# Patient Record
Sex: Male | Born: 1955 | Race: Black or African American | Hispanic: No | Marital: Single | Smoking: Current every day smoker
Health system: Southern US, Community
[De-identification: ages and names within clinical notes are randomized; demographics above are authoritative.]

## PROBLEM LIST (undated history)

## (undated) DIAGNOSIS — J45909 Unspecified asthma, uncomplicated: Secondary | ICD-10-CM

## (undated) DIAGNOSIS — K7460 Unspecified cirrhosis of liver: Secondary | ICD-10-CM

## (undated) DIAGNOSIS — F32A Depression, unspecified: Secondary | ICD-10-CM

## (undated) DIAGNOSIS — R7303 Prediabetes: Secondary | ICD-10-CM

## (undated) DIAGNOSIS — F419 Anxiety disorder, unspecified: Secondary | ICD-10-CM

## (undated) DIAGNOSIS — A159 Respiratory tuberculosis unspecified: Secondary | ICD-10-CM

## (undated) DIAGNOSIS — I10 Essential (primary) hypertension: Secondary | ICD-10-CM

## (undated) DIAGNOSIS — R0600 Dyspnea, unspecified: Secondary | ICD-10-CM

## (undated) DIAGNOSIS — Z87442 Personal history of urinary calculi: Secondary | ICD-10-CM

## (undated) DIAGNOSIS — C801 Malignant (primary) neoplasm, unspecified: Secondary | ICD-10-CM

## (undated) HISTORY — PX: CYSTOSCOPY: SUR368

## (undated) HISTORY — PX: OTHER SURGICAL HISTORY: SHX169

## (undated) HISTORY — PX: EYE SURGERY: SHX253

---

## 2001-12-19 ENCOUNTER — Emergency Department (HOSPITAL_COMMUNITY): Admission: EM | Admit: 2001-12-19 | Discharge: 2001-12-19 | Payer: Self-pay | Admitting: Emergency Medicine

## 2002-02-14 ENCOUNTER — Emergency Department (HOSPITAL_COMMUNITY): Admission: EM | Admit: 2002-02-14 | Discharge: 2002-02-14 | Payer: Self-pay | Admitting: Emergency Medicine

## 2002-02-14 ENCOUNTER — Encounter: Payer: Self-pay | Admitting: Emergency Medicine

## 2002-09-11 ENCOUNTER — Encounter: Payer: Self-pay | Admitting: Emergency Medicine

## 2002-09-11 ENCOUNTER — Emergency Department (HOSPITAL_COMMUNITY): Admission: EM | Admit: 2002-09-11 | Discharge: 2002-09-11 | Payer: Self-pay | Admitting: Emergency Medicine

## 2003-09-15 ENCOUNTER — Emergency Department (HOSPITAL_COMMUNITY): Admission: EM | Admit: 2003-09-15 | Discharge: 2003-09-15 | Payer: Self-pay | Admitting: Emergency Medicine

## 2005-07-19 ENCOUNTER — Emergency Department (HOSPITAL_COMMUNITY): Admission: EM | Admit: 2005-07-19 | Discharge: 2005-07-19 | Payer: Self-pay | Admitting: Family Medicine

## 2005-10-18 ENCOUNTER — Emergency Department (HOSPITAL_COMMUNITY): Admission: EM | Admit: 2005-10-18 | Discharge: 2005-10-18 | Payer: Self-pay | Admitting: Emergency Medicine

## 2006-02-18 ENCOUNTER — Ambulatory Visit (HOSPITAL_COMMUNITY): Admission: RE | Admit: 2006-02-18 | Discharge: 2006-02-18 | Payer: Self-pay | Admitting: Surgery

## 2006-02-20 ENCOUNTER — Emergency Department (HOSPITAL_COMMUNITY): Admission: EM | Admit: 2006-02-20 | Discharge: 2006-02-21 | Payer: Self-pay | Admitting: Emergency Medicine

## 2006-02-24 ENCOUNTER — Emergency Department (HOSPITAL_COMMUNITY): Admission: EM | Admit: 2006-02-24 | Discharge: 2006-02-25 | Payer: Self-pay | Admitting: Emergency Medicine

## 2015-03-31 ENCOUNTER — Emergency Department (HOSPITAL_COMMUNITY)
Admission: EM | Admit: 2015-03-31 | Discharge: 2015-03-31 | Disposition: A | Discharge status: Home / Self Care | Payer: Self-pay | Source: Home / Self Care | Attending: Family Medicine | Admitting: Family Medicine

## 2015-03-31 ENCOUNTER — Encounter (HOSPITAL_COMMUNITY): Payer: Self-pay | Admitting: Emergency Medicine

## 2015-03-31 HISTORY — DX: Unspecified asthma, uncomplicated: J45.909

## 2015-03-31 HISTORY — DX: Essential (primary) hypertension: I10

## 2015-03-31 NOTE — ED Notes (Signed)
Patient not in treatment room, no explanation to staff-

## 2015-03-31 NOTE — ED Notes (Signed)
Hypertension for 2 days per patient, has checked blood pressure at home and now feeling bad in general.  Off blood pressure medicine for 2 weeks, ran out

## 2016-07-15 ENCOUNTER — Emergency Department (HOSPITAL_BASED_OUTPATIENT_CLINIC_OR_DEPARTMENT_OTHER)
Admission: EM | Admit: 2016-07-15 | Discharge: 2016-07-15 | Disposition: A | Discharge status: Home / Self Care | Payer: No Typology Code available for payment source | Attending: Emergency Medicine | Admitting: Emergency Medicine

## 2016-07-15 ENCOUNTER — Emergency Department (HOSPITAL_BASED_OUTPATIENT_CLINIC_OR_DEPARTMENT_OTHER): Payer: No Typology Code available for payment source

## 2016-07-15 ENCOUNTER — Encounter (HOSPITAL_BASED_OUTPATIENT_CLINIC_OR_DEPARTMENT_OTHER): Payer: Self-pay | Admitting: *Deleted

## 2016-07-15 DIAGNOSIS — Y9389 Activity, other specified: Secondary | ICD-10-CM | POA: Insufficient documentation

## 2016-07-15 DIAGNOSIS — S199XXA Unspecified injury of neck, initial encounter: Secondary | ICD-10-CM | POA: Diagnosis present

## 2016-07-15 DIAGNOSIS — Y999 Unspecified external cause status: Secondary | ICD-10-CM | POA: Diagnosis not present

## 2016-07-15 DIAGNOSIS — I10 Essential (primary) hypertension: Secondary | ICD-10-CM | POA: Insufficient documentation

## 2016-07-15 DIAGNOSIS — S161XXA Strain of muscle, fascia and tendon at neck level, initial encounter: Secondary | ICD-10-CM | POA: Diagnosis not present

## 2016-07-15 DIAGNOSIS — Y92410 Unspecified street and highway as the place of occurrence of the external cause: Secondary | ICD-10-CM | POA: Diagnosis not present

## 2016-07-15 DIAGNOSIS — J45909 Unspecified asthma, uncomplicated: Secondary | ICD-10-CM | POA: Insufficient documentation

## 2016-07-15 DIAGNOSIS — F17200 Nicotine dependence, unspecified, uncomplicated: Secondary | ICD-10-CM | POA: Diagnosis not present

## 2016-07-15 DIAGNOSIS — S40022A Contusion of left upper arm, initial encounter: Secondary | ICD-10-CM | POA: Diagnosis not present

## 2016-07-15 MED ORDER — IBUPROFEN 800 MG PO TABS
800.0000 mg | ORAL_TABLET | Freq: Once | ORAL | Status: AC
  Administered 2016-07-15: 800 mg via ORAL
  Filled 2016-07-15: qty 1

## 2016-07-15 MED ORDER — METHOCARBAMOL 500 MG PO TABS: 500.0000 mg | ORAL_TABLET | Freq: Two times a day (BID) | ORAL | 0 refills | Status: DC

## 2016-07-15 MED ORDER — METHOCARBAMOL 500 MG PO TABS
750.0000 mg | ORAL_TABLET | Freq: Once | ORAL | Status: AC
  Administered 2016-07-15: 750 mg via ORAL
  Filled 2016-07-15: qty 2

## 2016-07-15 MED ORDER — NAPROXEN 500 MG PO TABS: 500.0000 mg | ORAL_TABLET | Freq: Two times a day (BID) | ORAL | 0 refills | Status: DC
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
## 2016-07-15 NOTE — Discharge Instructions (Signed)
Take naproxen for pain. Robaxin for muscle spasms. Try heating pads, stretches. Follow up with family doctor if not improving. Return if worsening symptoms.
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
## 2019-01-16 ENCOUNTER — Other Ambulatory Visit: Payer: Self-pay

## 2019-01-16 ENCOUNTER — Encounter (HOSPITAL_COMMUNITY): Payer: Self-pay

## 2019-01-16 ENCOUNTER — Emergency Department (HOSPITAL_COMMUNITY)
Admission: EM | Admit: 2019-01-16 | Discharge: 2019-01-16 | Disposition: A | Discharge status: Home / Self Care | Payer: No Typology Code available for payment source | Attending: Emergency Medicine | Admitting: Emergency Medicine

## 2019-01-16 DIAGNOSIS — Y92410 Unspecified street and highway as the place of occurrence of the external cause: Secondary | ICD-10-CM | POA: Insufficient documentation

## 2019-01-16 DIAGNOSIS — Y93I9 Activity, other involving external motion: Secondary | ICD-10-CM | POA: Insufficient documentation

## 2019-01-16 DIAGNOSIS — M7918 Myalgia, other site: Secondary | ICD-10-CM

## 2019-01-16 DIAGNOSIS — Y998 Other external cause status: Secondary | ICD-10-CM | POA: Insufficient documentation

## 2019-01-16 DIAGNOSIS — F17210 Nicotine dependence, cigarettes, uncomplicated: Secondary | ICD-10-CM | POA: Insufficient documentation

## 2019-01-16 DIAGNOSIS — S199XXA Unspecified injury of neck, initial encounter: Secondary | ICD-10-CM | POA: Diagnosis not present

## 2019-01-16 DIAGNOSIS — I10 Essential (primary) hypertension: Secondary | ICD-10-CM | POA: Insufficient documentation

## 2019-01-16 MED ORDER — CYCLOBENZAPRINE HCL 10 MG PO TABS: 10.0000 mg | ORAL_TABLET | Freq: Two times a day (BID) | ORAL | 0 refills | Status: DC | PRN
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
## 2019-01-16 NOTE — Discharge Instructions (Signed)
Please read attached information. If you experience any new or worsening signs or symptoms please return to the emergency room for evaluation. Please follow-up with your primary care provider or specialist as discussed. Please use medication prescribed only as directed and discontinue taking if you have any concerning signs or symptoms.   °

## 2019-01-16 NOTE — ED Notes (Signed)
Patient verbalizes understanding of discharge instructions . Opportunity for questions and answers were provided . Armband removed by staff ,Pt discharged from ED. W/C  offered at D/C  and Declined W/C at D/C and was escorted to lobby by RN.  

## 2020-07-30 ENCOUNTER — Other Ambulatory Visit: Payer: Self-pay | Admitting: Neurosurgery

## 2020-07-30 ENCOUNTER — Telehealth: Payer: Self-pay

## 2020-07-30 DIAGNOSIS — M5136 Other intervertebral disc degeneration, lumbar region: Secondary | ICD-10-CM

## 2020-07-31 NOTE — Telephone Encounter (Signed)
Phone call to patient to verify medication list and allergies for myelogram procedure. All medications pt is currently taking is safe to continue to take for myelogram procedure. Pt verbalized understanding. 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 Pt verbalized understanding.

## 2020-08-07 ENCOUNTER — Other Ambulatory Visit: Payer: Self-pay

## 2020-08-07 ENCOUNTER — Ambulatory Visit
Admission: RE | Admit: 2020-08-07 | Discharge: 2020-08-07 | Disposition: A | Discharge status: Home / Self Care | Payer: Non-veteran care | Source: Ambulatory Visit | Attending: Neurosurgery | Admitting: Neurosurgery

## 2020-08-07 DIAGNOSIS — M5136 Other intervertebral disc degeneration, lumbar region: Secondary | ICD-10-CM

## 2020-08-07 MED ORDER — DIAZEPAM 5 MG PO TABS
5.0000 mg | ORAL_TABLET | Freq: Once | ORAL | Status: AC
  Administered 2020-08-07: 5 mg via ORAL

## 2020-08-07 NOTE — Discharge Instructions (Signed)

## 2020-08-07 NOTE — Progress Notes (Signed)
Pt arrived for Myelogram procedure. After pt signed consent, vitals obtained, 5 mg PO Valium given pt decided he did not want this procedure today. Pt reported it was too close to christmas and would like to reschedule. Cathy, scheduler, notified of this and will contact patient.

## 2020-08-19 ENCOUNTER — Inpatient Hospital Stay
Admission: RE | Admit: 2020-08-19 | Discharge: 2020-08-19 | Disposition: A | Discharge status: Home / Self Care | Payer: Non-veteran care | Source: Ambulatory Visit | Attending: Neurosurgery | Admitting: Neurosurgery

## 2020-08-19 ENCOUNTER — Other Ambulatory Visit: Payer: Non-veteran care

## 2020-08-19 NOTE — Discharge Instructions (Signed)

## 2020-09-24 ENCOUNTER — Ambulatory Visit
Admission: RE | Admit: 2020-09-24 | Discharge: 2020-09-24 | Disposition: A | Discharge status: Home / Self Care | Payer: Non-veteran care | Source: Ambulatory Visit | Attending: Neurosurgery | Admitting: Neurosurgery

## 2020-09-24 ENCOUNTER — Other Ambulatory Visit: Payer: Self-pay

## 2020-09-24 ENCOUNTER — Other Ambulatory Visit: Payer: Self-pay | Admitting: Family Medicine

## 2020-09-24 MED ORDER — DIAZEPAM 5 MG PO TABS
5.0000 mg | ORAL_TABLET | Freq: Once | ORAL | Status: AC
  Administered 2020-09-24: 5 mg via ORAL

## 2020-09-24 MED ORDER — IOPAMIDOL (ISOVUE-M 200) INJECTION 41%
18.0000 mL | Freq: Once | INTRAMUSCULAR | Status: AC
  Administered 2020-09-24: 18 mL via INTRATHECAL

## 2020-09-24 NOTE — Discharge Instructions (Signed)

## 2021-06-01 ENCOUNTER — Encounter (HOSPITAL_COMMUNITY): Payer: Self-pay | Admitting: Student

## 2021-06-01 ENCOUNTER — Emergency Department (HOSPITAL_COMMUNITY)
Admission: EM | Admit: 2021-06-01 | Discharge: 2021-06-02 | Discharge status: Against Medical Advice | Payer: No Typology Code available for payment source | Attending: Student | Admitting: Student

## 2021-06-01 ENCOUNTER — Other Ambulatory Visit: Payer: Self-pay

## 2021-06-01 DIAGNOSIS — M7989 Other specified soft tissue disorders: Secondary | ICD-10-CM | POA: Insufficient documentation

## 2021-06-01 DIAGNOSIS — Z5321 Procedure and treatment not carried out due to patient leaving prior to being seen by health care provider: Secondary | ICD-10-CM | POA: Diagnosis not present

## 2021-06-01 MED ORDER — DIPHENHYDRAMINE HCL 25 MG PO CAPS
25.0000 mg | ORAL_CAPSULE | Freq: Once | ORAL | Status: AC
  Administered 2021-06-01: 25 mg via ORAL
  Filled 2021-06-01: qty 1

## 2021-06-01 NOTE — ED Provider Notes (Signed)
Emergency Medicine Provider Triage Evaluation Note  VASHON RIORDAN , a 65 y.o. male  was evaluated in triage.  Pt complains of right sided facial pain/swelling which is improving now. Patient states that earlier this evening he was eating chocolate with almonds/toffee and while he was chewing he felt a cramping pain to his right jaw and looked and it appeared the right side of his face was swelling. Overall this is improving, no current pain, state swelling is much better. No alleviating/aggravating factors.   Review of Systems  Positive: Jaw pain/swelling Negative: N/V, dyspnea, dysphagia, rash, fever  Physical Exam  BP (!) 164/106 (BP Location: Right Arm)   Pulse 71   Temp 98.5 F (36.9 C) (Oral)   Resp 20   SpO2 97%  Gen:   Awake, no distress   Resp:  Normal effort  MSK:   Moves extremities without difficulty  Other:  No angioedema present. Posterior oropharynx is symmetric appearing without edema. Patient tolerating own secretions without difficulty. No trismus. No drooling. No hot potato voice. No swelling beneath the tongue, submandibular compartment is soft. No palpable dental abscess noted.    Medical Decision Making  Medically screening exam initiated at 10:11 PM.  Appropriate orders placed.  ABDULAI BLAYLOCK was informed that the remainder of the evaluation will be completed by another provider, this initial triage assessment does not replace that evaluation, and the importance of remaining in the ED until their evaluation is complete.  Jaw pain/swelling.  No significant appreciable swelling on exam. No findings of angioedema. Not consistent w/ anaphylaxis. Will trial benadryl for symptomatic management.    Leafy Kindle 06/01/21 2220    Teressa Lower, MD 06/01/21 2354

## 2021-06-01 NOTE — ED Triage Notes (Signed)
Pt reports he was eating chocolate and nuts (common for him) says his right side of his face started to swell.  The swelling has since started to go down, no trouble swallowing, can speak in complete sentences.

## 2021-06-02 NOTE — ED Notes (Signed)
Pt called multiple times no answer 

## 2021-06-02 NOTE — ED Notes (Signed)
Called to take back to room.  No response.

## 2021-07-26 ENCOUNTER — Emergency Department (HOSPITAL_COMMUNITY): Payer: No Typology Code available for payment source

## 2021-07-26 ENCOUNTER — Other Ambulatory Visit: Payer: Self-pay

## 2021-07-26 ENCOUNTER — Emergency Department (HOSPITAL_COMMUNITY)
Admission: EM | Admit: 2021-07-26 | Discharge: 2021-07-26 | Disposition: A | Discharge status: Home / Self Care | Payer: No Typology Code available for payment source | Attending: Emergency Medicine | Admitting: Emergency Medicine

## 2021-07-26 ENCOUNTER — Encounter (HOSPITAL_COMMUNITY): Payer: Self-pay | Admitting: Emergency Medicine

## 2021-07-26 DIAGNOSIS — Z79899 Other long term (current) drug therapy: Secondary | ICD-10-CM | POA: Diagnosis not present

## 2021-07-26 DIAGNOSIS — I10 Essential (primary) hypertension: Secondary | ICD-10-CM | POA: Insufficient documentation

## 2021-07-26 DIAGNOSIS — R3121 Asymptomatic microscopic hematuria: Secondary | ICD-10-CM | POA: Diagnosis not present

## 2021-07-26 DIAGNOSIS — J45909 Unspecified asthma, uncomplicated: Secondary | ICD-10-CM | POA: Insufficient documentation

## 2021-07-26 DIAGNOSIS — R112 Nausea with vomiting, unspecified: Secondary | ICD-10-CM | POA: Diagnosis present

## 2021-07-26 DIAGNOSIS — F17210 Nicotine dependence, cigarettes, uncomplicated: Secondary | ICD-10-CM | POA: Diagnosis not present

## 2021-07-26 DIAGNOSIS — Z20822 Contact with and (suspected) exposure to covid-19: Secondary | ICD-10-CM | POA: Insufficient documentation

## 2021-07-26 DIAGNOSIS — R197 Diarrhea, unspecified: Secondary | ICD-10-CM | POA: Diagnosis not present

## 2021-07-26 LAB — CBC WITH DIFFERENTIAL/PLATELET
Abs Immature Granulocytes: 0.04 10*3/uL (ref 0.00–0.07)
Basophils Absolute: 0.1 10*3/uL (ref 0.0–0.1)
Basophils Relative: 1 %
Eosinophils Absolute: 0.2 10*3/uL (ref 0.0–0.5)
Eosinophils Relative: 2 %
HCT: 47.7 % (ref 39.0–52.0)
Hemoglobin: 15.8 g/dL (ref 13.0–17.0)
Immature Granulocytes: 0 %
Lymphocytes Relative: 45 %
Lymphs Abs: 5.2 10*3/uL — ABNORMAL HIGH (ref 0.7–4.0)
MCH: 30 pg (ref 26.0–34.0)
MCHC: 33.1 g/dL (ref 30.0–36.0)
MCV: 90.7 fL (ref 80.0–100.0)
Monocytes Absolute: 0.7 10*3/uL (ref 0.1–1.0)
Monocytes Relative: 7 %
Neutro Abs: 5.1 10*3/uL (ref 1.7–7.7)
Neutrophils Relative %: 45 %
Platelets: 239 10*3/uL (ref 150–400)
RBC: 5.26 MIL/uL (ref 4.22–5.81)
RDW: 14.1 % (ref 11.5–15.5)
WBC: 11.4 10*3/uL — ABNORMAL HIGH (ref 4.0–10.5)
nRBC: 0 % (ref 0.0–0.2)

## 2021-07-26 LAB — COMPREHENSIVE METABOLIC PANEL
ALT: 22 U/L (ref 0–44)
AST: 35 U/L (ref 15–41)
Albumin: 4.6 g/dL (ref 3.5–5.0)
Alkaline Phosphatase: 90 U/L (ref 38–126)
Anion gap: 12 (ref 5–15)
BUN: 14 mg/dL (ref 8–23)
CO2: 26 mmol/L (ref 22–32)
Calcium: 9.9 mg/dL (ref 8.9–10.3)
Chloride: 98 mmol/L (ref 98–111)
Creatinine, Ser: 1.05 mg/dL (ref 0.61–1.24)
GFR, Estimated: >60 mL/min (ref >60)
Glucose, Bld: 101 mg/dL — ABNORMAL HIGH (ref 70–99)
Potassium: 3.2 mmol/L — ABNORMAL LOW (ref 3.5–5.1)
Sodium: 136 mmol/L (ref 135–145)
Total Bilirubin: 0.4 mg/dL (ref 0.3–1.2)
Total Protein: 8.1 g/dL (ref 6.5–8.1)

## 2021-07-26 LAB — URINALYSIS, ROUTINE W REFLEX MICROSCOPIC
Bilirubin Urine: NEGATIVE
Glucose, UA: NEGATIVE mg/dL
Ketones, ur: 5 mg/dL — AB
Leukocytes,Ua: NEGATIVE
Nitrite: NEGATIVE
Protein, ur: 30 mg/dL — AB
RBC / HPF: >50 RBC/hpf — ABNORMAL HIGH (ref 0–5)
Specific Gravity, Urine: 1.018 (ref 1.005–1.030)
pH: 8 (ref 5.0–8.0)

## 2021-07-26 LAB — RESP PANEL BY RT-PCR (FLU A&B, COVID) ARPGX2
Influenza A by PCR: NEGATIVE
Influenza B by PCR: NEGATIVE
SARS Coronavirus 2 by RT PCR: NEGATIVE

## 2021-07-26 LAB — ETHANOL: Alcohol, Ethyl (B): <10 mg/dL (ref <10)

## 2021-07-26 LAB — TROPONIN I (HIGH SENSITIVITY): Troponin I (High Sensitivity): 5 ng/L (ref <18)

## 2021-07-26 LAB — LIPASE, BLOOD: Lipase: 130 U/L — ABNORMAL HIGH (ref 11–51)

## 2021-07-26 MED ORDER — ONDANSETRON HCL 4 MG/2ML IJ SOLN
4.0000 mg | Freq: Once | INTRAMUSCULAR | Status: AC
  Administered 2021-07-26: 4 mg via INTRAVENOUS
  Filled 2021-07-26: qty 2

## 2021-07-26 MED ORDER — ONDANSETRON 4 MG PO TBDP
4.0000 mg | ORAL_TABLET | Freq: Once | ORAL | Status: DC
  Filled 2021-07-26: qty 1

## 2021-07-26 MED ORDER — LABETALOL HCL 5 MG/ML IV SOLN
5.0000 mg | Freq: Once | INTRAVENOUS | Status: AC
  Administered 2021-07-26: 5 mg via INTRAVENOUS
  Filled 2021-07-26: qty 4

## 2021-07-26 MED ORDER — SODIUM CHLORIDE 0.9 % IV BOLUS
1000.0000 mL | Freq: Once | INTRAVENOUS | Status: AC
  Administered 2021-07-26: 1000 mL via INTRAVENOUS

## 2021-07-26 MED ORDER — METOCLOPRAMIDE HCL 10 MG PO TABS
10.0000 mg | ORAL_TABLET | Freq: Once | ORAL | Status: AC
  Administered 2021-07-26: 10 mg via ORAL
  Filled 2021-07-26: qty 1

## 2021-07-26 MED ORDER — AMLODIPINE BESYLATE 5 MG PO TABS
5.0000 mg | ORAL_TABLET | Freq: Once | ORAL | Status: AC
  Administered 2021-07-26: 5 mg via ORAL
  Filled 2021-07-26: qty 1

## 2021-07-26 MED ORDER — KETOROLAC TROMETHAMINE 15 MG/ML IJ SOLN
15.0000 mg | Freq: Once | INTRAMUSCULAR | Status: AC
  Administered 2021-07-26: 15 mg via INTRAVENOUS
  Filled 2021-07-26: qty 1

## 2021-07-26 MED ORDER — ONDANSETRON HCL 4 MG PO TABS: 4.0000 mg | ORAL_TABLET | Freq: Four times a day (QID) | ORAL | 0 refills | Status: AC

## 2021-07-26 NOTE — Discharge Instructions (Addendum)
Your lab work looks okay today.  As we discussed it is important that you limit your alcohol intake to avoid pancreatitis.  Be sure to stay hydrated as much as you can.  I have sent medication for nausea to your pharmacy for you to use as needed.  As discussed, you also have blood in your urine.  Be sure to stay as hydrated as you can and follow-up with your primary care provider about this.

## 2021-07-26 NOTE — ED Provider Notes (Signed)
Emergency Medicine Provider Triage Evaluation Note  Brian Lane , a 65 y.o. male  was evaluated in triage.  Presents to the emergency department with complaints of lead poisoning after taking unknown pills and then snorting an unknown substance.  Patient requesting to have his stomach pumped.  Patient with diarrhea in the waiting room bathroom and vomited on the floor in triage.  Denies associated symptoms.  Review of Systems  Positive: Vomiting diarrhea Negative: Abdominal pain, altered mental status  Physical Exam  BP (!) 173/102 (BP Location: Right Arm)   Pulse 76   Temp 98.7 F (37.1 C) (Oral)   Resp (!) 22   SpO2 92%  Gen:   Awake, no distress   Resp:  Normal effort  MSK:   Moves extremities without difficulty  Other:  Actively vomiting  Medical Decision Making  Medically screening exam initiated at 2:16 AM.  Appropriate orders placed.  Brian Lane was informed that the remainder of the evaluation will be completed by another provider, this initial triage assessment does not replace that evaluation, and the importance of remaining in the ED until their evaluation is complete.  Vomiting and diarrhea.  Unknown drug ingestion.   Jahmier Willadsen, Gwenlyn Perking 07/26/21 8250    Fatima Blank, MD 07/26/21 [phone]

## 2021-07-26 NOTE — ED Provider Notes (Addendum)
[facility] EMERGENCY DEPARTMENT Provider Note   CSN: [id number] Arrival date & time: 07/26/21  0125     History Chief Complaint  Patient presents with   Emesis   Diarrhea    Brian Lane is a 65 y.o. male with a past medical history of asthma and hypertension presenting today with complaint of nausea, vomiting, diarrhea and chills.  Patient reports that this all began early this morning.  It is started when he became nauseous, heard beeping that was not there and "became very cold."  No known sick contacts.  Complaining of chest pain.  No abdominal pain but overall myalgias.  Also stated that he took some pills last night that somebody gave him.  They reported that it was heroin however he feels as though there was something else, "mixed in with the machine."  He states that he occasionally uses crack cocaine and heroin.  What he took today did not appear to be heroin because the pill was "harder than usual."     Past Medical History:  Diagnosis Date   Asthma    Hypertension     There are no problems to display for this patient.   No past surgical history on file.     No family history on file.  Social History   Tobacco Use   Smoking status: Every Day    Types: Cigarettes   Smokeless tobacco: Never  Substance Use Topics   Alcohol use: Yes    Comment: occ   Drug use: Never    Home Medications Prior to Admission medications   Medication Sig Start Date End Date Taking? Authorizing Provider  amLODipine (NORVASC) 5 MG tablet Take 5 mg by mouth daily.    [provider]  cyclobenzaprine (FLEXERIL) 10 MG tablet Take 1 tablet (10 mg total) by mouth 2 (two) times daily as needed for muscle spasms. Patient not taking: Reported on 07/31/2020 01/16/19   Hedges, Dellis Filbert, PA-C  methocarbamol (ROBAXIN) 500 MG tablet Take 1 tablet (500 mg total) by mouth 2 (two) times daily. Patient not taking: Reported on 07/31/2020 07/15/16   Jeannett Senior, PA-C  naproxen (NAPROSYN) 500 MG tablet Take 1 tablet (500 mg total) by mouth 2 (two) times daily. Patient not taking: Reported on 07/31/2020 07/15/16   Jeannett Senior, PA-C  OVER THE COUNTER MEDICATION Fluid pills Patient not taking: Reported on 07/31/2020    [provider]    Allergies    Patient has no known allergies.  Review of Systems   Review of Systems  Constitutional:  Positive for chills.  Cardiovascular:  Positive for chest pain.  Gastrointestinal:  Positive for diarrhea, nausea and vomiting. Negative for abdominal pain.  Genitourinary:  Negative for dysuria.  Musculoskeletal:  Positive for myalgias.  Neurological:  Positive for weakness.  All other systems reviewed and are negative.  Physical Exam Updated Vital Signs BP (!) 158/113   Pulse 74   Temp 98.7 F (37.1 C) (Oral)   Resp 14   SpO2 94%   Physical Exam Vitals and nursing note reviewed.  Constitutional:      General: He is not in acute distress.    Appearance: Normal appearance. He is ill-appearing.     Comments: Vomit and diarrhea on patient's clothes  HENT:     Head: Normocephalic and atraumatic.     Mouth/Throat:     Mouth: Mucous membranes are dry.     Pharynx: Oropharynx is clear.  Eyes:  General: No scleral icterus.    Conjunctiva/sclera: Conjunctivae normal.  Cardiovascular:     Rate and Rhythm: Normal rate and regular rhythm.  Pulmonary:     Effort: Pulmonary effort is normal. No respiratory distress.     Breath sounds: No wheezing or rales.  Abdominal:     Tenderness: There is no abdominal tenderness. There is no guarding.  Skin:    General: Skin is warm and dry.     Findings: No rash.  Neurological:     Mental Status: He is alert.  Psychiatric:        Mood and Affect: Mood normal.        Behavior: Behavior normal.    ED Results / Procedures / Treatments   Labs (all labs ordered are listed, but only abnormal results are displayed) Labs Reviewed  CBC  WITH DIFFERENTIAL/PLATELET - Abnormal; Notable for the following components:      Result Value   WBC 11.4 (*)    Lymphs Abs 5.2 (*)    All other components within normal limits  COMPREHENSIVE METABOLIC PANEL - Abnormal; Notable for the following components:   Potassium 3.2 (*)    Glucose, Bld 101 (*)    All other components within normal limits  LIPASE, BLOOD - Abnormal; Notable for the following components:   Lipase 130 (*)    All other components within normal limits  URINALYSIS, ROUTINE W REFLEX MICROSCOPIC - Abnormal; Notable for the following components:   APPearance HAZY (*)    Hgb urine dipstick MODERATE (*)    Ketones, ur 5 (*)    Protein, ur 30 (*)    RBC / HPF >50 (*)    Bacteria, UA RARE (*)    All other components within normal limits  RESP PANEL BY RT-PCR (FLU A&B, COVID) ARPGX2  ETHANOL  RAPID URINE DRUG SCREEN, HOSP PERFORMED  TROPONIN I (HIGH SENSITIVITY)    EKG None  Radiology No results found.  Procedures Procedures   Medications Ordered in ED Medications  sodium chloride 0.9 % bolus 1,000 mL (has no administration in time range)  ondansetron (ZOFRAN) injection 4 mg (has no administration in time range)    ED Course  I have reviewed the triage vital signs and the nursing notes.  Pertinent labs & imaging results that were available during my care of the patient were reviewed by me and considered in my medical decision making (see chart for details).    MDM Rules/Calculators/A&P 65 year old male, past medical history substance use disorder and hypertension presented with a complaint of nausea, vomiting and diarrhea.  Labs nonrevealing, lipase elevated to 130 however no abdominal pain.  Low likelihood of acute pancreatitis.  No imaging needed.  RN notified me that patient was unable to urinate.  Urinated in the waiting room.  He agreed to an in and out cath and nursing was unable to get the catheter into the patient's bladder due to some sort of  obstruction.  Bladder scanner only revealed 50 cc.  We will hydrate the patient and see if he can urinate.  Fluid COVID test negative today.  Patient requesting to leave however I told him that we need to see him urinary before we can safely let him leave the department.  He voiced understanding.  Patient urinated and urinalysis reveals microscopic hematuria.  Patient is asymptomatic of urinary symptoms.  It is possible that this is from traumatic attempt at in and out catheter.  UDS is not back at this time.  Patient has been here for many hours and UDS would not change our plan at this time.  Patient to be discharged without the results per his request.  Zofran sent to his pharmacy.  We discussed the importance of limiting his alcohol intake and avoidance of cocaine and heroin which he reports he uses occasionally.  He will make an appointment about his hematuria with his primary care provider and assure resolution.  Discharge.  Final Clinical Impression(s) / ED Diagnoses Final diagnoses:  Nausea vomiting and diarrhea  Asymptomatic microscopic hematuria    Rx / DC Orders ED Discharge Orders          Ordered    ondansetron (ZOFRAN) 4 MG tablet  Every 6 hours        07/26/21 1358           Results and diagnoses were explained to the patient. Return precautions discussed in full. Patient had no additional questions and expressed complete understanding.    Darliss Ridgel 07/26/21 1430    Blanchie Dessert, MD 07/26/21 1457

## 2021-07-26 NOTE — ED Triage Notes (Signed)
Pt presents from home stating he thinks he "has lead poisoning"  Pt had episode of diarrhea in the lobby and large amount of emesis everywhere in triage. Pt states someone crushed pills and he snorted them earlier.

## 2021-11-23 DIAGNOSIS — Z0001 Encounter for general adult medical examination with abnormal findings: Secondary | ICD-10-CM | POA: Diagnosis not present

## 2021-11-23 DIAGNOSIS — Z79899 Other long term (current) drug therapy: Secondary | ICD-10-CM | POA: Diagnosis not present

## 2021-11-23 DIAGNOSIS — E785 Hyperlipidemia, unspecified: Secondary | ICD-10-CM | POA: Diagnosis not present

## 2021-11-23 DIAGNOSIS — E46 Unspecified protein-calorie malnutrition: Secondary | ICD-10-CM | POA: Diagnosis not present

## 2021-11-23 DIAGNOSIS — K5900 Constipation, unspecified: Secondary | ICD-10-CM | POA: Diagnosis not present

## 2021-11-23 DIAGNOSIS — I10 Essential (primary) hypertension: Secondary | ICD-10-CM | POA: Diagnosis not present

## 2021-11-23 DIAGNOSIS — J449 Chronic obstructive pulmonary disease, unspecified: Secondary | ICD-10-CM | POA: Diagnosis not present

## 2021-12-01 ENCOUNTER — Other Ambulatory Visit: Payer: Self-pay | Admitting: Radiation Oncology

## 2021-12-01 ENCOUNTER — Ambulatory Visit
Admission: RE | Admit: 2021-12-01 | Discharge: 2021-12-01 | Disposition: A | Discharge status: Home / Self Care | Payer: Self-pay | Source: Ambulatory Visit | Attending: Radiation Oncology | Admitting: Radiation Oncology

## 2021-12-01 DIAGNOSIS — C61 Malignant neoplasm of prostate: Secondary | ICD-10-CM

## 2021-12-07 NOTE — Progress Notes (Signed)
GU Location of Tumor / Histology: Prostate Ca ? ?If Prostate Cancer, Gleason Score is (3 + 3) and PSA is (6.88 as of 10/2021) ? ?Biopsies  ?Dr. Domenica Fail ? ? ? ?Past/Anticipated interventions by urology, if any: NA ? ?Past/Anticipated interventions by medical oncology, if any: NA ? ?Weight changes, if any:  Yes, 5-10 lbs. ? ?IPSS:  17 ?SHIM:  16 ? ?Bowel/Bladder complaints, if any:  Yes, feeling gassy has medication to help. ? ?Nausea/Vomiting, if any:  No ? ?Pain issues, if any:  0/10 ? ?SAFETY ISSUES: ?Prior radiation?  No ?Pacemaker/ICD?  No ?Possible current pregnancy? Male ?Is the patient on methotrexate?  No ? ?Current Complaints / other details:  Need more information on treatment options. ?

## 2021-12-11 ENCOUNTER — Ambulatory Visit
Admission: RE | Admit: 2021-12-11 | Discharge: 2021-12-11 | Disposition: A | Discharge status: Home / Self Care | Payer: No Typology Code available for payment source | Source: Ambulatory Visit | Attending: Radiation Oncology | Admitting: Radiation Oncology

## 2021-12-11 ENCOUNTER — Other Ambulatory Visit: Payer: Self-pay

## 2021-12-11 VITALS — BP 140/95 | HR 78 | Temp 98.0°F | Resp 18 | Ht 66.0 in | Wt 115.4 lb

## 2021-12-11 DIAGNOSIS — M545 Low back pain, unspecified: Secondary | ICD-10-CM | POA: Insufficient documentation

## 2021-12-11 DIAGNOSIS — I10 Essential (primary) hypertension: Secondary | ICD-10-CM | POA: Insufficient documentation

## 2021-12-11 DIAGNOSIS — G4700 Insomnia, unspecified: Secondary | ICD-10-CM | POA: Insufficient documentation

## 2021-12-11 DIAGNOSIS — E042 Nontoxic multinodular goiter: Secondary | ICD-10-CM | POA: Insufficient documentation

## 2021-12-11 DIAGNOSIS — E559 Vitamin D deficiency, unspecified: Secondary | ICD-10-CM | POA: Insufficient documentation

## 2021-12-11 DIAGNOSIS — F323 Major depressive disorder, single episode, severe with psychotic features: Secondary | ICD-10-CM | POA: Insufficient documentation

## 2021-12-11 DIAGNOSIS — Z791 Long term (current) use of non-steroidal anti-inflammatories (NSAID): Secondary | ICD-10-CM | POA: Insufficient documentation

## 2021-12-11 DIAGNOSIS — S8390XA Sprain of unspecified site of unspecified knee, initial encounter: Secondary | ICD-10-CM | POA: Insufficient documentation

## 2021-12-11 DIAGNOSIS — K5900 Constipation, unspecified: Secondary | ICD-10-CM | POA: Insufficient documentation

## 2021-12-11 DIAGNOSIS — M5416 Radiculopathy, lumbar region: Secondary | ICD-10-CM | POA: Insufficient documentation

## 2021-12-11 DIAGNOSIS — M542 Cervicalgia: Secondary | ICD-10-CM | POA: Insufficient documentation

## 2021-12-11 DIAGNOSIS — M25511 Pain in right shoulder: Secondary | ICD-10-CM | POA: Insufficient documentation

## 2021-12-11 DIAGNOSIS — Z79899 Other long term (current) drug therapy: Secondary | ICD-10-CM | POA: Diagnosis not present

## 2021-12-11 DIAGNOSIS — R0789 Other chest pain: Secondary | ICD-10-CM | POA: Insufficient documentation

## 2021-12-11 DIAGNOSIS — IMO0001 Reserved for inherently not codable concepts without codable children: Secondary | ICD-10-CM | POA: Insufficient documentation

## 2021-12-11 DIAGNOSIS — Z7982 Long term (current) use of aspirin: Secondary | ICD-10-CM | POA: Insufficient documentation

## 2021-12-11 DIAGNOSIS — K30 Functional dyspepsia: Secondary | ICD-10-CM | POA: Insufficient documentation

## 2021-12-11 DIAGNOSIS — C61 Malignant neoplasm of prostate: Secondary | ICD-10-CM

## 2021-12-11 DIAGNOSIS — J45909 Unspecified asthma, uncomplicated: Secondary | ICD-10-CM | POA: Diagnosis not present

## 2021-12-11 DIAGNOSIS — B182 Chronic viral hepatitis C: Secondary | ICD-10-CM | POA: Insufficient documentation

## 2021-12-11 DIAGNOSIS — Z9889 Other specified postprocedural states: Secondary | ICD-10-CM | POA: Insufficient documentation

## 2021-12-11 DIAGNOSIS — Z5689 Other problems related to employment: Secondary | ICD-10-CM | POA: Insufficient documentation

## 2021-12-11 DIAGNOSIS — R0602 Shortness of breath: Secondary | ICD-10-CM | POA: Insufficient documentation

## 2021-12-11 DIAGNOSIS — F17210 Nicotine dependence, cigarettes, uncomplicated: Secondary | ICD-10-CM | POA: Insufficient documentation

## 2021-12-11 DIAGNOSIS — S46819A Strain of other muscles, fascia and tendons at shoulder and upper arm level, unspecified arm, initial encounter: Secondary | ICD-10-CM | POA: Insufficient documentation

## 2021-12-11 DIAGNOSIS — E782 Mixed hyperlipidemia: Secondary | ICD-10-CM | POA: Insufficient documentation

## 2021-12-11 DIAGNOSIS — R937 Abnormal findings on diagnostic imaging of other parts of musculoskeletal system: Secondary | ICD-10-CM | POA: Insufficient documentation

## 2021-12-11 DIAGNOSIS — K219 Gastro-esophageal reflux disease without esophagitis: Secondary | ICD-10-CM | POA: Insufficient documentation

## 2021-12-11 DIAGNOSIS — N200 Calculus of kidney: Secondary | ICD-10-CM | POA: Insufficient documentation

## 2021-12-11 DIAGNOSIS — E041 Nontoxic single thyroid nodule: Secondary | ICD-10-CM | POA: Insufficient documentation

## 2021-12-11 DIAGNOSIS — M25519 Pain in unspecified shoulder: Secondary | ICD-10-CM | POA: Insufficient documentation

## 2021-12-11 DIAGNOSIS — N138 Other obstructive and reflux uropathy: Secondary | ICD-10-CM | POA: Insufficient documentation

## 2021-12-11 DIAGNOSIS — R7303 Prediabetes: Secondary | ICD-10-CM | POA: Insufficient documentation

## 2021-12-11 DIAGNOSIS — F1910 Other psychoactive substance abuse, uncomplicated: Secondary | ICD-10-CM | POA: Insufficient documentation

## 2021-12-11 DIAGNOSIS — M48061 Spinal stenosis, lumbar region without neurogenic claudication: Secondary | ICD-10-CM | POA: Insufficient documentation

## 2021-12-11 DIAGNOSIS — M25569 Pain in unspecified knee: Secondary | ICD-10-CM | POA: Insufficient documentation

## 2021-12-11 DIAGNOSIS — J439 Emphysema, unspecified: Secondary | ICD-10-CM | POA: Insufficient documentation

## 2021-12-11 DIAGNOSIS — F32A Depression, unspecified: Secondary | ICD-10-CM | POA: Insufficient documentation

## 2021-12-11 DIAGNOSIS — I7091 Generalized atherosclerosis: Secondary | ICD-10-CM | POA: Insufficient documentation

## 2021-12-11 DIAGNOSIS — Z8619 Personal history of other infectious and parasitic diseases: Secondary | ICD-10-CM | POA: Insufficient documentation

## 2021-12-11 DIAGNOSIS — K7460 Unspecified cirrhosis of liver: Secondary | ICD-10-CM | POA: Insufficient documentation

## 2021-12-11 DIAGNOSIS — R972 Elevated prostate specific antigen [PSA]: Secondary | ICD-10-CM | POA: Insufficient documentation

## 2021-12-11 NOTE — Progress Notes (Signed)
?Radiation Oncology         [phone] ?________________________________ ? ?Initial Outpatient Consultation ? ?Name: Brian Lane MRN: [id number]  ?Date: 12/11/2021  DOB: April 23, 1956 ? ?EG:BTDVVO, Fonda Kinder, MD  ? ?REFERRING PHYSICIAN: Alphia Kava, MD ? ?DIAGNOSIS: 66 y.o. gentleman with Stage T1c adenocarcinoma of the prostate with Gleason score of 3+3, and PSA of 6.88. ? ?  ICD-10-CM   ?1. Malignant neoplasm of prostate ([place])  C61   ?  ? ? ?HISTORY OF PRESENT ILLNESS: Brian Lane is a 67 y.o. male with a diagnosis of prostate cancer. He was noted to have an elevated PSA of 5.2 by his primary care physician at the [facility].  Accordingly, he underwent prostate MRI on 03/09/21 showing a 0.6 cm PI-RADS 4 lesion within the right posterior lateral peripheral zone at the apex. There was no pelvic lymphadenopathy or additional findings to suggest osseous metastatic disease.  A repeat PSA in 10/2021 showed a rise to 6.88. The patient proceeded to transrectal ultrasound with 12 biopsies of the prostate on 11/03/21 under the care of of Dr. Domenica Fail.  The prostate volume measured 48 cc.  Out of 14 core biopsies, 6 were positive.  The maximum Gleason score was 3+3, and this was seen in two right-sided samples (5%), two left-sided samples (5%), and both ROI samples (25%). ? ?The patient reviewed the biopsy results with his urologist and he has kindly been referred today for discussion of potential radiation treatment options. ? ? ?PREVIOUS RADIATION THERAPY: No ? ?PAST MEDICAL HISTORY:  ?Past Medical History:  ?Diagnosis Date  ? Asthma   ? Hypertension   ?   ? ?PAST SURGICAL HISTORY:No past surgical history on file. ? ?FAMILY HISTORY: No family history on file. ? ?SOCIAL HISTORY:  ?Social History  ? ?Socioeconomic History  ? Marital status: Single  ?  Spouse name: Not on file  ? Number of children: Not on file  ? Years of education: Not on file  ? Highest education level: Not on file   ?Occupational History  ? Not on file  ?Tobacco Use  ? Smoking status: Every Day  ?  Types: Cigarettes  ? Smokeless tobacco: Never  ?Substance and Sexual Activity  ? Alcohol use: Yes  ?  Comment: occ  ? Drug use: Never  ? Sexual activity: Not on file  ?Other Topics Concern  ? Not on file  ?Social History Narrative  ? Not on file  ? ?Social Determinants of Health  ? ?Financial Resource Strain: Not on file  ?Food Insecurity: Not on file  ?Transportation Needs: Not on file  ?Physical Activity: Not on file  ?Stress: Not on file  ?Social Connections: Not on file  ?Intimate Partner Violence: Not on file  ? ? ?ALLERGIES: Patient has no known allergies. ? ?MEDICATIONS:  ?Current Outpatient Medications  ?Medication Sig Dispense Refill  ? amLODipine (NORVASC) 5 MG tablet Take 5 mg by mouth daily.    ? atorvastatin (LIPITOR) 40 MG tablet TAKE ONE TABLET BY MOUTH AT BEDTIME FOR CHOLESTEROL.  NOTIFY CLINIC FOR UNUSUAL MUSCLE ACHES OR WEAKNESS, BROWN URINE, BLOOD IN  URINE, YELLOWING OF SKIN/WHITES OF EYES, PERSISTENT  NAUSEA/VOMITING/DIARRHEA FOR CHOLESTEROL.  NOTIFY CLINIC FOR UNUSUAL MUSCLE ACHES OR WEAKNESS, BROWN URINE, BLOOD IN  URINE, YELLOWING OF SKIN/WHITES OF EYES, PERSISTENT  NAUSEA/VOMITING/DIARRHEA    ? Cholecalciferol 25 MCG (1000 UT) tablet TAKE ONE TABLET BY MOUTH DAILY FOR VITAMIN D DEFICIENCY    ? LINZESS 145 MCG CAPS  capsule Take 145 mcg by mouth every morning.    ? oxybutynin (DITROPAN) 5 MG tablet Take 1 tablet by mouth at bedtime.    ? tamsulosin (FLOMAX) 0.4 MG CAPS capsule TAKE ONE CAPSULE BY MOUTH DAILY FOR URINATION, GET UP SLOWLY FROM SEATED OR LYING POSITION, HOLD DOSE FOR  SYSTOLIC BLOOD PRESSURE  LESS THAN 100  30 MINUTES AFTER LAST MEAL OF THE DAY FOR URINATION, GET UP SLOWLY FROM SEATED OR LYING POSITION, HOLD DOSE FOR   SYSTOLIC BLOOD PRESSURE  LESS THAN 100  30 MINUTES AFTER LAST MEAL OF THE DAY    ? aspirin 81 MG EC tablet Take 1 tablet by mouth daily. (Patient not taking: Reported on 12/11/2021)     ? cyclobenzaprine (FLEXERIL) 10 MG tablet Take 1 tablet (10 mg total) by mouth 2 (two) times daily as needed for muscle spasms. (Patient not taking: Reported on 07/31/2020) 20 tablet 0  ? methocarbamol (ROBAXIN) 500 MG tablet Take 1 tablet (500 mg total) by mouth 2 (two) times daily. (Patient not taking: Reported on 07/31/2020) 20 tablet 0  ? naproxen (NAPROSYN) 500 MG tablet Take 1 tablet (500 mg total) by mouth 2 (two) times daily. (Patient not taking: Reported on 07/31/2020) 30 tablet 0  ? OVER THE COUNTER MEDICATION Fluid pills (Patient not taking: Reported on 07/31/2020)    ? tiZANidine (ZANAFLEX) 4 MG tablet TAKE ONE TABLET BY MOUTH THREE TIMES A DAY FOR NECK PAIN (Patient not taking: Reported on 12/11/2021)    ? ?No current facility-administered medications for this encounter.  ? ? ?REVIEW OF SYSTEMS:  On review of systems, the patient reports that he is doing well overall. He denies any chest pain, shortness of breath, cough, fevers, chills, night sweats. He reports weight change of 5-10 lbs. He denies abdominal pain, nausea or vomiting. He reports some gassy feeling. He denies any new musculoskeletal or joint aches or pains. His IPSS was 17, indicating moderate urinary symptoms. His SHIM was 16, indicating he has moderate erectile dysfunction. A complete review of systems is obtained and is otherwise negative. ? ?  ?PHYSICAL EXAM:  ?Wt Readings from Last 3 Encounters:  ?12/11/21 115 lb 6 oz (52.3 kg)  ?07/15/16 128 lb (58.1 kg)  ? ?Temp Readings from Last 3 Encounters:  ?12/11/21 98 ?F (36.7 ?C) (Temporal)  ?07/26/21 98.7 ?F (37.1 ?C) (Oral)  ?06/01/21 98.5 ?F (36.9 ?C) (Oral)  ? ?BP Readings from Last 3 Encounters:  ?12/11/21 (!) 140/95  ?07/26/21 (!) 162/104  ?06/01/21 (!) 164/106  ? ?Pulse Readings from Last 3 Encounters:  ?12/11/21 78  ?07/26/21 85  ?06/01/21 71  ? ?Pain Assessment ?Pain Score: 0-No pain/10 ? ?In general this is a well appearing African-American male in no acute distress. He's alert  and oriented x4 and appropriate throughout the examination. Cardiopulmonary assessment is negative for acute distress, and he exhibits normal effort.   ? ? ?KPS = 100 ? ?100 - Normal; no complaints; no evidence of disease. ?90   - Able to carry on normal activity; minor signs or symptoms of disease. ?80   - Normal activity with effort; some signs or symptoms of disease. ?34   - Cares for self; unable to carry on normal activity or to do active work. ?60   - Requires occasional assistance, but is able to care for most of his personal needs. ?50   - Requires considerable assistance and frequent medical care. ?56   - Disabled; requires special care and assistance. ?30   -  Severely disabled; hospital admission is indicated although death not imminent. ?20   - Very sick; hospital admission necessary; active supportive treatment necessary. ?10   - Moribund; fatal processes progressing rapidly. ?0     - Dead ? ?Karnofsky DA, Abelmann WH, Craver LS and Burchenal [facility] [phone]) The use of the nitrogen mustards in the palliative treatment of carcinoma: with particular reference to bronchogenic carcinoma Cancer 1 634-56 ? ?LABORATORY DATA:  ?Lab Results  ?Component Value Date  ? WBC 11.4 (H) 07/26/2021  ? HGB 15.8 07/26/2021  ? HCT 47.7 07/26/2021  ? MCV 90.7 07/26/2021  ? PLT 239 07/26/2021  ? ?Lab Results  ?Component Value Date  ? NA 136 07/26/2021  ? K 3.2 (L) 07/26/2021  ? CL 98 07/26/2021  ? CO2 26 07/26/2021  ? ?Lab Results  ?Component Value Date  ? ALT 22 07/26/2021  ? AST 35 07/26/2021  ? ALKPHOS 90 07/26/2021  ? BILITOT 0.4 07/26/2021  ? ?  ?RADIOGRAPHY: No results found. ?   ?IMPRESSION/PLAN: ?1. 66 y.o. gentleman with Stage T1c adenocarcinoma of the prostate with Gleason Score of 3+3, and PSA of 6.88. ?We discussed the patient's workup and outlined the nature of prostate cancer in this setting. The patient's T stage, Gleason's score, and PSA put him into the low risk group. Accordingly, he is eligible for a variety of  potential treatment options including brachytherapy, 5.5 weeks of external radiation, or prostatectomy. We discussed the available radiation techniques, and focused on the details and logistics of delivery.  He is

## 2021-12-24 NOTE — Progress Notes (Signed)
Patient was a consult on 12/11/2021 for stage T1c adenocarcinoma of the prostate with Gleason Score of 3+3, and PSA of 6.88. ? ?Patient was scheduled for surgical consult with Dr. Tresa Moore @ [facility] for 12/22/2021.  ? ?RN spoke with patient today to follow up on treatment decision/additional questions.  ? ?Patient was unable to make recent appointment for surgical consult.  RN provided [facility] contact information for patient to reschedule, verbalized thankfulness and agreement.  ? ?Will continue to follow after surgical consult is rescheduled.  ?

## 2022-01-19 ENCOUNTER — Emergency Department (HOSPITAL_COMMUNITY)
Admission: EM | Admit: 2022-01-19 | Discharge: 2022-01-19 | Disposition: A | Discharge status: Home / Self Care | Payer: No Typology Code available for payment source | Attending: Emergency Medicine | Admitting: Emergency Medicine

## 2022-01-19 ENCOUNTER — Encounter (HOSPITAL_COMMUNITY): Payer: Self-pay | Admitting: Emergency Medicine

## 2022-01-19 ENCOUNTER — Other Ambulatory Visit: Payer: Self-pay

## 2022-01-19 DIAGNOSIS — M791 Myalgia, unspecified site: Secondary | ICD-10-CM | POA: Diagnosis not present

## 2022-01-19 DIAGNOSIS — Z8546 Personal history of malignant neoplasm of prostate: Secondary | ICD-10-CM | POA: Insufficient documentation

## 2022-01-19 DIAGNOSIS — R110 Nausea: Secondary | ICD-10-CM | POA: Insufficient documentation

## 2022-01-19 DIAGNOSIS — R109 Unspecified abdominal pain: Secondary | ICD-10-CM | POA: Diagnosis not present

## 2022-01-19 DIAGNOSIS — R140 Abdominal distension (gaseous): Secondary | ICD-10-CM | POA: Diagnosis present

## 2022-01-19 HISTORY — DX: Malignant (primary) neoplasm, unspecified: C80.1

## 2022-01-19 LAB — BASIC METABOLIC PANEL
Anion gap: 7 (ref 5–15)
BUN: 11 mg/dL (ref 8–23)
CO2: 29 mmol/L (ref 22–32)
Calcium: 9.1 mg/dL (ref 8.9–10.3)
Chloride: 106 mmol/L (ref 98–111)
Creatinine, Ser: 0.78 mg/dL (ref 0.61–1.24)
GFR, Estimated: >60 mL/min (ref >60)
Glucose, Bld: 96 mg/dL (ref 70–99)
Potassium: 3.8 mmol/L (ref 3.5–5.1)
Sodium: 142 mmol/L (ref 135–145)

## 2022-01-19 LAB — CBC WITH DIFFERENTIAL/PLATELET
Abs Immature Granulocytes: 0.01 10*3/uL (ref 0.00–0.07)
Basophils Absolute: 0.1 10*3/uL (ref 0.0–0.1)
Basophils Relative: 1 %
Eosinophils Absolute: 0.3 10*3/uL (ref 0.0–0.5)
Eosinophils Relative: 4 %
HCT: 41.3 % (ref 39.0–52.0)
Hemoglobin: 13.8 g/dL (ref 13.0–17.0)
Immature Granulocytes: 0 %
Lymphocytes Relative: 38 %
Lymphs Abs: 3 10*3/uL (ref 0.7–4.0)
MCH: 30.6 pg (ref 26.0–34.0)
MCHC: 33.4 g/dL (ref 30.0–36.0)
MCV: 91.6 fL (ref 80.0–100.0)
Monocytes Absolute: 0.8 10*3/uL (ref 0.1–1.0)
Monocytes Relative: 10 %
Neutro Abs: 3.7 10*3/uL (ref 1.7–7.7)
Neutrophils Relative %: 47 %
Platelets: 180 10*3/uL (ref 150–400)
RBC: 4.51 MIL/uL (ref 4.22–5.81)
RDW: 13.5 % (ref 11.5–15.5)
WBC: 7.9 10*3/uL (ref 4.0–10.5)
nRBC: 0 % (ref 0.0–0.2)

## 2022-01-19 LAB — URINALYSIS, ROUTINE W REFLEX MICROSCOPIC
Bilirubin Urine: NEGATIVE
Glucose, UA: NEGATIVE mg/dL
Hgb urine dipstick: NEGATIVE
Ketones, ur: NEGATIVE mg/dL
Leukocytes,Ua: NEGATIVE
Nitrite: NEGATIVE
Protein, ur: NEGATIVE mg/dL
Specific Gravity, Urine: 1.006 (ref 1.005–1.030)
pH: 7 (ref 5.0–8.0)

## 2022-01-19 NOTE — ED Provider Notes (Signed)
[facility] DEPT Provider Note   CSN: [id number] Arrival date & time: 01/19/22  1932     History {Add pertinent medical, surgical, social history, OB history to HPI:1} Chief Complaint  Patient presents with   Generalized Body Aches   Nausea    Brian Lane is a 66 y.o. male.  HPI He is presenting for evaluation of abdominal bloating.  He has history of prostate cancer.  He is currently has been trying to decide on prostatectomy versus brachytherapy; and has decided on prostatectomy.  He talked to his urologist, yesterday about that.  He has history of major depression.  He states he has been urinating a lot.  He is trying to eat but feels like he gets full too early.  He is taking his usual medicine including methadone for chronic pain.    Home Medications Prior to Admission medications   Medication Sig Start Date End Date Taking? Authorizing Provider  amLODipine (NORVASC) 5 MG tablet Take 5 mg by mouth daily.    [provider]  aspirin 81 MG EC tablet Take 1 tablet by mouth daily. Patient not taking: Reported on 12/11/2021 12/19/17   [provider]  atorvastatin (LIPITOR) 40 MG tablet TAKE ONE TABLET BY MOUTH AT BEDTIME FOR CHOLESTEROL.  NOTIFY CLINIC FOR UNUSUAL MUSCLE ACHES OR WEAKNESS, BROWN URINE, BLOOD IN  URINE, YELLOWING OF SKIN/WHITES OF EYES, PERSISTENT  NAUSEA/VOMITING/DIARRHEA FOR CHOLESTEROL.  NOTIFY [facility] OR WEAKNESS, BROWN URINE, BLOOD IN  URINE, YELLOWING OF SKIN/WHITES OF EYES, PERSISTENT  NAUSEA/VOMITING/DIARRHEA 08/18/21   [provider]  Cholecalciferol 25 MCG (1000 UT) tablet TAKE ONE TABLET BY MOUTH DAILY FOR VITAMIN D DEFICIENCY 08/21/21   [provider]  cyclobenzaprine (FLEXERIL) 10 MG tablet Take 1 tablet (10 mg total) by mouth 2 (two) times daily as needed for muscle spasms. Patient not taking: Reported on 07/31/2020 01/16/19   Okey Regal, PA-C  LINZESS 145 MCG  CAPS capsule Take 145 mcg by mouth every morning. 11/23/21   [provider]  methocarbamol (ROBAXIN) 500 MG tablet Take 1 tablet (500 mg total) by mouth 2 (two) times daily. Patient not taking: Reported on 07/31/2020 07/15/16   Jeannett Senior, PA-C  naproxen (NAPROSYN) 500 MG tablet Take 1 tablet (500 mg total) by mouth 2 (two) times daily. Patient not taking: Reported on 07/31/2020 07/15/16   Jeannett Senior, PA-C  OVER THE COUNTER MEDICATION Fluid pills Patient not taking: Reported on 07/31/2020    [provider]  oxybutynin (DITROPAN) 5 MG tablet Take 1 tablet by mouth at bedtime. 05/26/21   [provider]  tamsulosin (FLOMAX) 0.4 MG CAPS capsule TAKE ONE CAPSULE BY MOUTH DAILY FOR URINATION, GET UP SLOWLY FROM SEATED OR LYING POSITION, HOLD DOSE FOR  SYSTOLIC BLOOD PRESSURE  LESS THAN 100  30 MINUTES AFTER LAST MEAL OF THE DAY FOR URINATION, GET UP SLOWLY FROM SEATED OR LYING POSITION, HOLD DOSE FOR   SYSTOLIC BLOOD PRESSURE  LESS THAN 100  30 MINUTES AFTER LAST MEAL OF THE DAY 05/26/21   [provider]  tiZANidine (ZANAFLEX) 4 MG tablet TAKE ONE TABLET BY MOUTH THREE TIMES A DAY FOR NECK PAIN Patient not taking: Reported on 12/11/2021 02/11/21   [provider]      Allergies    Patient has no known allergies.    Review of Systems   Review of Systems  Physical Exam Updated Vital Signs BP 118/80   Pulse (!) 59  Temp 98.5 F (36.9 C) (Oral)   Resp 15   SpO2 97%  Physical Exam Vitals and nursing note reviewed.  Constitutional:      Appearance: He is well-developed. He is not ill-appearing.  HENT:     Head: Normocephalic and atraumatic.     Right Ear: External ear normal.     Left Ear: External ear normal.  Eyes:     Conjunctiva/sclera: Conjunctivae normal.     Pupils: Pupils are equal, round, and reactive to light.  Neck:     Trachea: Phonation normal.  Cardiovascular:     Rate and Rhythm: Normal rate.  Pulmonary:      Effort: Pulmonary effort is normal.  Abdominal:     General: There is no distension.  Musculoskeletal:        General: Normal range of motion.     Cervical back: Normal range of motion and neck supple.  Skin:    General: Skin is warm and dry.  Neurological:     Mental Status: He is alert and oriented to person, place, and time.     Cranial Nerves: No cranial nerve deficit.     Sensory: No sensory deficit.     Motor: No abnormal muscle tone.     Coordination: Coordination normal.  Psychiatric:        Mood and Affect: Mood normal.        Behavior: Behavior normal.        Thought Content: Thought content normal.        Judgment: Judgment normal.    ED Results / Procedures / Treatments   Labs (all labs ordered are listed, but only abnormal results are displayed) Labs Reviewed  URINALYSIS, ROUTINE W REFLEX MICROSCOPIC - Abnormal; Notable for the following components:      Result Value   Color, Urine STRAW (*)    All other components within normal limits    EKG None  Radiology No results found.  Procedures Procedures  {Document cardiac monitor, telemetry assessment procedure when appropriate:1}  Medications Ordered in ED Medications - No data to display  ED Course/ Medical Decision Making/ A&P                           Medical Decision Making Amount and/or Complexity of Data Reviewed Labs: ordered.   ***  {Document critical care time when appropriate:1} {Document review of labs and clinical decision tools ie heart score, Chads2Vasc2 etc:1}  {Document your independent review of radiology images, and any outside records:1} {Document your discussion with family members, caretakers, and with consultants:1} {Document social determinants of health affecting pt's care:1} {Document your decision making why or why not admission, treatments were needed:1} Final Clinical Impression(s) / ED Diagnoses Final diagnoses:  None    Rx / DC Orders ED Discharge Orders      None

## 2022-01-19 NOTE — Progress Notes (Signed)
Patient was a consult on 12/11/2021 for stage T1c adenocarcinoma of the prostate with Gleason Score of 3+3, and PSA of 6.88.     Patient was undecided between prostatectomy and brachytherapy.  Proceed with consult with Dr. Tresa Moore yesterday, 6/5, and he treatment decision for prostatectomy was finalized.

## 2022-01-19 NOTE — ED Notes (Signed)
Bladder Scan results: 30m

## 2022-01-19 NOTE — Discharge Instructions (Addendum)
The testing today does not show urinary tract infection or urinary tract outlet obstruction.  Continue taking your usual medication.  Use Tylenol 650 mg every 4 hours as needed for abdominal pain.  Try to eat a high-fiber diet and drink plenty of fluids especially water.  Call your doctor for follow-up appointment next week.

## 2022-01-19 NOTE — ED Triage Notes (Addendum)
The patient complains of feeling like body is shaky and cramping. He also reports difficulty breathing, urinary frequency, having difficulty keeping liquids down and abdominal bloating. Symptoms have lasted for 2-3 days. He is concerned that  Unsure  if his methadone is reacting poorly in his body with the other medications he is taking.

## 2022-01-20 ENCOUNTER — Other Ambulatory Visit: Payer: Self-pay | Admitting: Urology

## 2022-02-02 NOTE — Patient Instructions (Addendum)
DUE TO COVID-19 ONLY TWO VISITORS  (aged 67 and older)  ARE ALLOWED TO COME WITH YOU AND STAY IN THE WAITING ROOM ONLY DURING PRE OP AND PROCEDURE.   **NO VISITORS ARE ALLOWED IN THE SHORT STAY AREA OR RECOVERY ROOM!!**  IF YOU WILL BE ADMITTED INTO THE HOSPITAL YOU ARE ALLOWED ONLY FOUR SUPPORT PEOPLE DURING VISITATION HOURS ONLY (7 AM -8PM)   The support person(s) must pass our screening, gel in and out, and wear a mask at all times, including in the patient's room. Patients must also wear a mask when staff or their support person are in the room. Visitors GUEST BADGE MUST BE WORN VISIBLY  One adult visitor may remain with you overnight and MUST be in the room by 8 P.M.     Your procedure is scheduled on: 02/17/22   Report to [facility] Main Entrance    Report to admitting at 9:45 AM   Call this number if you have problems the morning of surgery [phone]   Follow clear liquid diet the day before surgery   You may have the following liquids until 9:00 AM DAY OF SURGERY  Water Black Coffee (sugar ok, NO MILK/CREAM OR CREAMERS)  Tea (sugar ok, NO MILK/CREAM OR CREAMERS) regular and decaf                             Plain Jell-O (NO RED)                                           Fruit ices (not with fruit pulp, NO RED)                                     Popsicles (NO RED)                                                                  Juice: apple, WHITE grape, WHITE cranberry Sports drinks like Gatorade (NO RED) Clear broth(vegetable,chicken,beef)  FOLLOW BOWEL PREP AND ANY ADDITIONAL PRE OP INSTRUCTIONS YOU RECEIVED FROM YOUR SURGEON'S OFFICE!!!     Oral Hygiene is also important to reduce your risk of infection.                                    Remember - BRUSH YOUR TEETH THE MORNING OF SURGERY WITH YOUR REGULAR TOOTHPASTE   Do NOT smoke after Midnight   Take these medicines the morning of surgery with A SIP OF WATER: Amlodipine, Tamsulosin.                               You may not have any metal on your body including jewelry, and body piercing             Do not wear lotions, powders, cologne, or deodorant              Men  may shave face and neck.   Do not bring valuables to the hospital. [place].   Bring small overnight bag day of surgery.   DO NOT [place]. PHARMACY WILL DISPENSE MEDICATIONS LISTED ON YOUR MEDICATION LIST TO YOU DURING YOUR ADMISSION [place]!               Please read over the following fact sheets you were given: IF YOU HAVE QUESTIONS ABOUT YOUR PRE-OP INSTRUCTIONS PLEASE CALL [facility] - Preparing for Surgery Before surgery, you can play an important role.  Because skin is not sterile, your skin needs to be as free of germs as possible.  You can reduce the number of germs on your skin by washing with CHG (chlorahexidine gluconate) soap before surgery.  CHG is an antiseptic cleaner which kills germs and bonds with the skin to continue killing germs even after washing. Please DO NOT use if you have an allergy to CHG or antibacterial soaps.  If your skin becomes reddened/irritated stop using the CHG and inform your nurse when you arrive at Short Stay. Do not shave (including legs and underarms) for at least 48 hours prior to the first CHG shower.  You may shave your face/neck.  Please follow these instructions carefully:  1.  Shower with CHG Soap the night before surgery and the  morning of surgery.  2.  If you choose to wash your hair, wash your hair first as usual with your normal  shampoo.  3.  After you shampoo, rinse your hair and body thoroughly to remove the shampoo.                             4.  Use CHG as you would any other liquid soap.  You can apply chg directly to the skin and wash.  Gently with a scrungie or clean washcloth.  5.  Apply the CHG Soap to your body ONLY FROM THE NECK DOWN.    Do   not use on face/ open                           Wound or open sores. Avoid contact with eyes, ears mouth and   genitals (private parts).                       Wash face,  Genitals (private parts) with your normal soap.             6.  Wash thoroughly, paying special attention to the area where your    surgery  will be performed.  7.  Thoroughly rinse your body with warm water from the neck down.  8.  DO NOT shower/wash with your normal soap after using and rinsing off the CHG Soap.                9.  Pat yourself dry with a clean towel.            10.  Wear clean pajamas.            11.  Place clean sheets on your bed the night of your first shower and do not  sleep with pets. Day of Surgery :  Do not apply any lotions/deodorants the morning of surgery.  Please wear clean clothes to the hospital/surgery center.  FAILURE TO FOLLOW THESE INSTRUCTIONS MAY RESULT IN THE CANCELLATION OF YOUR SURGERY  PATIENT SIGNATURE_________________________________  NURSE SIGNATURE__________________________________  ________________________________________________________________________

## 2022-02-02 NOTE — Progress Notes (Addendum)
COVID Vaccine Completed: no  Date of COVID positive in last 90 days: no  PCP - [facility], [place] Cardiologist - n/a  Chest x-ray - 07/26/21 Epic EKG - 07/26/21 Epic Stress Test - about a year ago per pt ECHO - yes per pt Cardiac Cath - n/a Pacemaker/ICD device last checked: n/a Spinal Cord Stimulator: n/a  Bowel Prep - Miralax, clears day before  Sleep Study - n/a CPAP -   Fasting Blood Sugar - pre Dm per pt  Checks Blood Sugar _____ times a day  Blood Thinner Instructions: n/a Aspirin Instructions: Last Dose:  Activity level: Can go up a flight of stairs and perform activities of daily living without stopping and without symptoms of chest pain or shortness of breath.   Anesthesia review: pt on [facility]adone [facility] [address], IV drug use (heroin), HTN  Patient denies shortness of breath, fever, cough and chest pain at PAT appointment  Patient verbalized understanding of instructions that were given to them at the PAT appointment. Patient was also instructed that they will need to review over the PAT instructions again at home before surgery.

## 2022-02-04 ENCOUNTER — Other Ambulatory Visit: Payer: Self-pay | Admitting: Student

## 2022-02-04 DIAGNOSIS — R221 Localized swelling, mass and lump, neck: Secondary | ICD-10-CM

## 2022-02-08 ENCOUNTER — Other Ambulatory Visit: Payer: No Typology Code available for payment source

## 2022-02-10 ENCOUNTER — Ambulatory Visit
Admission: RE | Admit: 2022-02-10 | Discharge: 2022-02-10 | Disposition: A | Discharge status: Home / Self Care | Payer: No Typology Code available for payment source | Source: Ambulatory Visit | Attending: Student | Admitting: Student

## 2022-02-10 DIAGNOSIS — R221 Localized swelling, mass and lump, neck: Secondary | ICD-10-CM

## 2022-02-11 ENCOUNTER — Encounter (HOSPITAL_COMMUNITY)
Admission: RE | Admit: 2022-02-11 | Discharge: 2022-02-11 | Disposition: A | Discharge status: Home / Self Care | Payer: No Typology Code available for payment source | Source: Ambulatory Visit | Attending: Urology | Admitting: Urology

## 2022-02-11 ENCOUNTER — Encounter (HOSPITAL_COMMUNITY): Payer: Self-pay

## 2022-02-11 VITALS — BP 126/81 | HR 69 | Temp 98.7°F | Resp 12 | Ht 66.0 in | Wt 113.6 lb

## 2022-02-11 DIAGNOSIS — Z01812 Encounter for preprocedural laboratory examination: Secondary | ICD-10-CM | POA: Diagnosis present

## 2022-02-11 DIAGNOSIS — Z8619 Personal history of other infectious and parasitic diseases: Secondary | ICD-10-CM | POA: Diagnosis not present

## 2022-02-11 DIAGNOSIS — R7303 Prediabetes: Secondary | ICD-10-CM | POA: Insufficient documentation

## 2022-02-11 HISTORY — DX: Depression, unspecified: F32.A

## 2022-02-11 HISTORY — DX: Anxiety disorder, unspecified: F41.9

## 2022-02-11 HISTORY — DX: Prediabetes: R73.03

## 2022-02-11 HISTORY — DX: Respiratory tuberculosis unspecified: A15.9

## 2022-02-11 HISTORY — DX: Unspecified cirrhosis of liver: K74.60

## 2022-02-11 HISTORY — DX: Personal history of urinary calculi: Z87.442

## 2022-02-11 HISTORY — DX: Dyspnea, unspecified: R06.00

## 2022-02-11 LAB — COMPREHENSIVE METABOLIC PANEL
ALT: 23 U/L (ref 0–44)
AST: 30 U/L (ref 15–41)
Albumin: 3.8 g/dL (ref 3.5–5.0)
Alkaline Phosphatase: 79 U/L (ref 38–126)
Anion gap: 7 (ref 5–15)
BUN: 10 mg/dL (ref 8–23)
CO2: 25 mmol/L (ref 22–32)
Calcium: 9 mg/dL (ref 8.9–10.3)
Chloride: 106 mmol/L (ref 98–111)
Creatinine, Ser: 0.9 mg/dL (ref 0.61–1.24)
GFR, Estimated: >60 mL/min (ref >60)
Glucose, Bld: 135 mg/dL — ABNORMAL HIGH (ref 70–99)
Potassium: 3.7 mmol/L (ref 3.5–5.1)
Sodium: 138 mmol/L (ref 135–145)
Total Bilirubin: 0.7 mg/dL (ref 0.3–1.2)
Total Protein: 6.8 g/dL (ref 6.5–8.1)

## 2022-02-11 LAB — GLUCOSE, CAPILLARY: Glucose-Capillary: 164 mg/dL — ABNORMAL HIGH (ref 70–99)

## 2022-02-12 LAB — HEMOGLOBIN A1C
Hgb A1c MFr Bld: 6 % — ABNORMAL HIGH (ref 4.8–5.6)
Mean Plasma Glucose: 126 mg/dL

## 2022-02-17 ENCOUNTER — Encounter (HOSPITAL_COMMUNITY): Payer: Self-pay | Admitting: Urology

## 2022-02-17 ENCOUNTER — Other Ambulatory Visit: Payer: Self-pay

## 2022-02-17 ENCOUNTER — Observation Stay (HOSPITAL_COMMUNITY)
Admission: RE | Admit: 2022-02-17 | Discharge: 2022-02-19 | Disposition: A | Discharge status: Home / Self Care | Payer: No Typology Code available for payment source | Source: Ambulatory Visit | Attending: Urology | Admitting: Urology

## 2022-02-17 ENCOUNTER — Encounter (HOSPITAL_COMMUNITY)
Admission: RE | Disposition: A | Discharge status: Home / Self Care | Payer: Self-pay | Source: Ambulatory Visit | Attending: Urology

## 2022-02-17 ENCOUNTER — Ambulatory Visit (HOSPITAL_BASED_OUTPATIENT_CLINIC_OR_DEPARTMENT_OTHER): Payer: No Typology Code available for payment source | Admitting: Certified Registered Nurse Anesthetist

## 2022-02-17 ENCOUNTER — Ambulatory Visit (HOSPITAL_COMMUNITY): Payer: No Typology Code available for payment source | Admitting: Physician Assistant

## 2022-02-17 DIAGNOSIS — F17210 Nicotine dependence, cigarettes, uncomplicated: Secondary | ICD-10-CM

## 2022-02-17 DIAGNOSIS — C61 Malignant neoplasm of prostate: Principal | ICD-10-CM | POA: Diagnosis present

## 2022-02-17 DIAGNOSIS — F418 Other specified anxiety disorders: Secondary | ICD-10-CM

## 2022-02-17 DIAGNOSIS — R7309 Other abnormal glucose: Secondary | ICD-10-CM | POA: Insufficient documentation

## 2022-02-17 DIAGNOSIS — K4090 Unilateral inguinal hernia, without obstruction or gangrene, not specified as recurrent: Secondary | ICD-10-CM | POA: Diagnosis not present

## 2022-02-17 DIAGNOSIS — J45909 Unspecified asthma, uncomplicated: Secondary | ICD-10-CM | POA: Insufficient documentation

## 2022-02-17 DIAGNOSIS — I10 Essential (primary) hypertension: Secondary | ICD-10-CM | POA: Diagnosis not present

## 2022-02-17 DIAGNOSIS — J449 Chronic obstructive pulmonary disease, unspecified: Secondary | ICD-10-CM

## 2022-02-17 DIAGNOSIS — R7303 Prediabetes: Secondary | ICD-10-CM

## 2022-02-17 HISTORY — PX: ROBOT ASSISTED LAPAROSCOPIC RADICAL PROSTATECTOMY: SHX5141

## 2022-02-17 HISTORY — PX: LYMPH NODE DISSECTION: SHX5087

## 2022-02-17 LAB — GLUCOSE, CAPILLARY: Glucose-Capillary: 107 mg/dL — ABNORMAL HIGH (ref 70–99)

## 2022-02-17 LAB — HEMOGLOBIN AND HEMATOCRIT, BLOOD
HCT: 43.1 % (ref 39.0–52.0)
Hemoglobin: 14.2 g/dL (ref 13.0–17.0)

## 2022-02-17 SURGERY — PROSTATECTOMY, RADICAL, ROBOT-ASSISTED, LAPAROSCOPIC
Anesthesia: General | Site: Abdomen

## 2022-02-17 MED ORDER — SUGAMMADEX SODIUM 200 MG/2ML IV SOLN
INTRAVENOUS | Status: DC | PRN
  Administered 2022-02-17: 125 mg via INTRAVENOUS

## 2022-02-17 MED ORDER — ATORVASTATIN CALCIUM 40 MG PO TABS
40.0000 mg | ORAL_TABLET | Freq: Every day | ORAL | Status: DC
  Administered 2022-02-17 – 2022-02-19 (×3): 40 mg via ORAL
  Filled 2022-02-17 (×4): qty 1

## 2022-02-17 MED ORDER — ONDANSETRON HCL 4 MG/2ML IJ SOLN
INTRAMUSCULAR | Status: AC
  Filled 2022-02-17: qty 2

## 2022-02-17 MED ORDER — 0.9 % SODIUM CHLORIDE (POUR BTL) OPTIME
TOPICAL | Status: DC | PRN
  Administered 2022-02-17: 1000 mL

## 2022-02-17 MED ORDER — POLYETHYLENE GLYCOL 3350 17 GM/SCOOP PO POWD: 1.0000 | Freq: Once | ORAL | Status: DC

## 2022-02-17 MED ORDER — FENTANYL CITRATE (PF) 100 MCG/2ML IJ SOLN
INTRAMUSCULAR | Status: DC | PRN
  Administered 2022-02-17: 100 ug via INTRAVENOUS
  Administered 2022-02-17 (×3): 50 ug via INTRAVENOUS

## 2022-02-17 MED ORDER — PROPOFOL 10 MG/ML IV BOLUS
INTRAVENOUS | Status: AC
  Filled 2022-02-17: qty 20

## 2022-02-17 MED ORDER — OXYCODONE HCL 5 MG/5ML PO SOLN: 5.0000 mg | Freq: Once | ORAL | Status: DC | PRN

## 2022-02-17 MED ORDER — CEFAZOLIN SODIUM-DEXTROSE 2-4 GM/100ML-% IV SOLN
2.0000 g | INTRAVENOUS | Status: AC
  Administered 2022-02-17: 2 g via INTRAVENOUS
  Filled 2022-02-17: qty 100

## 2022-02-17 MED ORDER — SODIUM CHLORIDE (PF) 0.9 % IJ SOLN
INTRAMUSCULAR | Status: AC
  Filled 2022-02-17: qty 20

## 2022-02-17 MED ORDER — HYDROMORPHONE HCL 1 MG/ML IJ SOLN
0.2500 mg | INTRAMUSCULAR | Status: DC | PRN
  Administered 2022-02-17 (×4): 0.5 mg via INTRAVENOUS

## 2022-02-17 MED ORDER — PHENYLEPHRINE HCL-NACL 20-0.9 MG/250ML-% IV SOLN
INTRAVENOUS | Status: DC | PRN
  Administered 2022-02-17: 35 ug/min via INTRAVENOUS

## 2022-02-17 MED ORDER — FENTANYL CITRATE PF 50 MCG/ML IJ SOSY
PREFILLED_SYRINGE | INTRAMUSCULAR | Status: AC
  Filled 2022-02-17: qty 3

## 2022-02-17 MED ORDER — SENNOSIDES-DOCUSATE SODIUM 8.6-50 MG PO TABS
2.0000 | ORAL_TABLET | Freq: Every day | ORAL | Status: DC
  Administered 2022-02-17 – 2022-02-18 (×2): 2 via ORAL
  Filled 2022-02-17 (×2): qty 2

## 2022-02-17 MED ORDER — KETOROLAC TROMETHAMINE 15 MG/ML IJ SOLN
15.0000 mg | Freq: Four times a day (QID) | INTRAMUSCULAR | Status: AC
  Administered 2022-02-17 – 2022-02-18 (×5): 15 mg via INTRAVENOUS
  Filled 2022-02-17 (×5): qty 1

## 2022-02-17 MED ORDER — AMISULPRIDE (ANTIEMETIC) 5 MG/2ML IV SOLN: 10.0000 mg | Freq: Once | INTRAVENOUS | Status: DC | PRN

## 2022-02-17 MED ORDER — TAMSULOSIN HCL 0.4 MG PO CAPS
0.4000 mg | ORAL_CAPSULE | Freq: Every evening | ORAL | Status: DC
  Administered 2022-02-17: 0.4 mg via ORAL
  Filled 2022-02-17 (×2): qty 1

## 2022-02-17 MED ORDER — CHLORHEXIDINE GLUCONATE 0.12 % MT SOLN
15.0000 mL | Freq: Once | OROMUCOSAL | Status: AC
  Administered 2022-02-17: 15 mL via OROMUCOSAL

## 2022-02-17 MED ORDER — FENTANYL CITRATE (PF) 250 MCG/5ML IJ SOLN
INTRAMUSCULAR | Status: AC
  Filled 2022-02-17: qty 5

## 2022-02-17 MED ORDER — OXYCODONE HCL 5 MG PO TABS: 5.0000 mg | ORAL_TABLET | Freq: Once | ORAL | Status: DC | PRN

## 2022-02-17 MED ORDER — SODIUM CHLORIDE (PF) 0.9 % IJ SOLN
INTRAMUSCULAR | Status: DC | PRN
  Administered 2022-02-17: 20 mL

## 2022-02-17 MED ORDER — KETAMINE HCL 50 MG/5ML IJ SOSY
PREFILLED_SYRINGE | INTRAMUSCULAR | Status: AC
  Filled 2022-02-17: qty 5

## 2022-02-17 MED ORDER — MORPHINE SULFATE (PF) 2 MG/ML IV SOLN
2.0000 mg | INTRAVENOUS | Status: DC | PRN
  Administered 2022-02-17 (×3): 4 mg via INTRAVENOUS
  Filled 2022-02-17 (×4): qty 2

## 2022-02-17 MED ORDER — ALBUTEROL SULFATE HFA 108 (90 BASE) MCG/ACT IN AERS: 2.0000 | INHALATION_SPRAY | RESPIRATORY_TRACT | Status: DC | PRN

## 2022-02-17 MED ORDER — HYDROMORPHONE HCL 1 MG/ML IJ SOLN
INTRAMUSCULAR | Status: AC
  Filled 2022-02-17: qty 1

## 2022-02-17 MED ORDER — LACTATED RINGERS IV SOLN: INTRAVENOUS | Status: DC | PRN

## 2022-02-17 MED ORDER — BUPIVACAINE LIPOSOME 1.3 % IJ SUSP
INTRAMUSCULAR | Status: DC | PRN
  Administered 2022-02-17: 20 mL

## 2022-02-17 MED ORDER — HYDROMORPHONE HCL 1 MG/ML IJ SOLN
INTRAMUSCULAR | Status: AC
  Filled 2022-02-17: qty 2

## 2022-02-17 MED ORDER — BUSPIRONE HCL 5 MG PO TABS
7.5000 mg | ORAL_TABLET | Freq: Two times a day (BID) | ORAL | Status: DC
  Administered 2022-02-17 – 2022-02-19 (×4): 7.5 mg via ORAL
  Filled 2022-02-17 (×4): qty 2
  Filled 2022-02-17: qty 1.5

## 2022-02-17 MED ORDER — ORAL CARE MOUTH RINSE: 15.0000 mL | Freq: Once | OROMUCOSAL | Status: AC

## 2022-02-17 MED ORDER — DEXAMETHASONE SODIUM PHOSPHATE 10 MG/ML IJ SOLN
INTRAMUSCULAR | Status: AC
  Filled 2022-02-17: qty 1

## 2022-02-17 MED ORDER — ACETAMINOPHEN 500 MG PO TABS
1000.0000 mg | ORAL_TABLET | Freq: Four times a day (QID) | ORAL | Status: AC
  Administered 2022-02-17 – 2022-02-18 (×3): 1000 mg via ORAL
  Filled 2022-02-17 (×4): qty 2

## 2022-02-17 MED ORDER — ALBUTEROL SULFATE (2.5 MG/3ML) 0.083% IN NEBU: 2.5000 mg | INHALATION_SOLUTION | RESPIRATORY_TRACT | Status: DC | PRN

## 2022-02-17 MED ORDER — ROCURONIUM BROMIDE 10 MG/ML (PF) SYRINGE
PREFILLED_SYRINGE | INTRAVENOUS | Status: AC
  Filled 2022-02-17: qty 10

## 2022-02-17 MED ORDER — EPHEDRINE 5 MG/ML INJ
INTRAVENOUS | Status: AC
  Filled 2022-02-17: qty 5

## 2022-02-17 MED ORDER — DEXAMETHASONE SODIUM PHOSPHATE 10 MG/ML IJ SOLN
INTRAMUSCULAR | Status: DC | PRN
  Administered 2022-02-17: 10 mg via INTRAVENOUS

## 2022-02-17 MED ORDER — LACTATED RINGERS IV SOLN: INTRAVENOUS | Status: DC

## 2022-02-17 MED ORDER — ROCURONIUM BROMIDE 10 MG/ML (PF) SYRINGE
PREFILLED_SYRINGE | INTRAVENOUS | Status: DC | PRN
  Administered 2022-02-17: 60 mg via INTRAVENOUS
  Administered 2022-02-17: 10 mg via INTRAVENOUS

## 2022-02-17 MED ORDER — BUPIVACAINE LIPOSOME 1.3 % IJ SUSP
INTRAMUSCULAR | Status: AC
Start: 2022-02-17 — End: ?
  Filled 2022-02-17: qty 20

## 2022-02-17 MED ORDER — PHENYLEPHRINE 80 MCG/ML (10ML) SYRINGE FOR IV PUSH (FOR BLOOD PRESSURE SUPPORT)
PREFILLED_SYRINGE | INTRAVENOUS | Status: DC | PRN
  Administered 2022-02-17 (×2): 80 ug via INTRAVENOUS

## 2022-02-17 MED ORDER — OXYCODONE HCL 5 MG PO TABS
5.0000 mg | ORAL_TABLET | ORAL | Status: DC | PRN
  Administered 2022-02-17: 5 mg via ORAL
  Filled 2022-02-17: qty 1

## 2022-02-17 MED ORDER — FENTANYL CITRATE PF 50 MCG/ML IJ SOSY
25.0000 ug | PREFILLED_SYRINGE | INTRAMUSCULAR | Status: DC | PRN
  Administered 2022-02-17 (×3): 50 ug via INTRAVENOUS

## 2022-02-17 MED ORDER — ONDANSETRON HCL 4 MG/2ML IJ SOLN
4.0000 mg | Freq: Once | INTRAMUSCULAR | Status: AC | PRN
  Administered 2022-02-17: 4 mg via INTRAVENOUS

## 2022-02-17 MED ORDER — PHENYLEPHRINE 80 MCG/ML (10ML) SYRINGE FOR IV PUSH (FOR BLOOD PRESSURE SUPPORT)
PREFILLED_SYRINGE | INTRAVENOUS | Status: AC
  Filled 2022-02-17: qty 10

## 2022-02-17 MED ORDER — EPHEDRINE SULFATE-NACL 50-0.9 MG/10ML-% IV SOSY
PREFILLED_SYRINGE | INTRAVENOUS | Status: DC | PRN
  Administered 2022-02-17: 5 mg via INTRAVENOUS

## 2022-02-17 MED ORDER — AMLODIPINE BESYLATE 5 MG PO TABS
5.0000 mg | ORAL_TABLET | Freq: Every day | ORAL | Status: DC
  Administered 2022-02-17 – 2022-02-19 (×3): 5 mg via ORAL
  Filled 2022-02-17 (×4): qty 1

## 2022-02-17 MED ORDER — ONDANSETRON HCL 4 MG/2ML IJ SOLN
4.0000 mg | INTRAMUSCULAR | Status: DC | PRN
Start: 2022-02-17 — End: 2022-02-19
  Administered 2022-02-17 – 2022-02-19 (×4): 4 mg via INTRAVENOUS
  Filled 2022-02-17 (×4): qty 2

## 2022-02-17 MED ORDER — SODIUM CHLORIDE 0.9 % IV BOLUS
1000.0000 mL | Freq: Once | INTRAVENOUS | Status: AC
  Administered 2022-02-17: 1000 mL via INTRAVENOUS

## 2022-02-17 MED ORDER — LIDOCAINE 2% (20 MG/ML) 5 ML SYRINGE
INTRAMUSCULAR | Status: DC | PRN
  Administered 2022-02-17: 60 mg via INTRAVENOUS

## 2022-02-17 MED ORDER — MIDAZOLAM HCL 5 MG/5ML IJ SOLN
INTRAMUSCULAR | Status: DC | PRN
  Administered 2022-02-17 (×2): 1 mg via INTRAVENOUS

## 2022-02-17 MED ORDER — LACTATED RINGERS IR SOLN
Status: DC | PRN
  Administered 2022-02-17: 1000 mL

## 2022-02-17 MED ORDER — HYDROMORPHONE HCL 1 MG/ML IJ SOLN
0.2500 mg | INTRAMUSCULAR | Status: DC | PRN
  Administered 2022-02-17 (×2): 0.5 mg via INTRAVENOUS

## 2022-02-17 MED ORDER — LIDOCAINE HCL (PF) 2 % IJ SOLN
INTRAMUSCULAR | Status: AC
  Filled 2022-02-17: qty 5

## 2022-02-17 MED ORDER — MIDAZOLAM HCL 2 MG/2ML IJ SOLN
INTRAMUSCULAR | Status: AC
  Filled 2022-02-17: qty 2

## 2022-02-17 MED ORDER — ONDANSETRON HCL 4 MG/2ML IJ SOLN
INTRAMUSCULAR | Status: DC | PRN
  Administered 2022-02-17: 4 mg via INTRAVENOUS

## 2022-02-17 MED ORDER — PROPOFOL 10 MG/ML IV BOLUS
INTRAVENOUS | Status: DC | PRN
  Administered 2022-02-17: 100 mg via INTRAVENOUS

## 2022-02-17 MED ORDER — KETAMINE HCL 10 MG/ML IJ SOLN
INTRAMUSCULAR | Status: DC | PRN
  Administered 2022-02-17: 20 mg via INTRAVENOUS
  Administered 2022-02-17: 30 mg via INTRAVENOUS

## 2022-02-17 SURGICAL SUPPLY — 78 items
ADH SKN CLS APL DERMABOND .7 (GAUZE/BANDAGES/DRESSINGS) ×2
APL PRP STRL LF DISP 70% ISPRP (MISCELLANEOUS) ×2
APL SWBSTK 6 STRL LF DISP (MISCELLANEOUS) ×2
APPLICATOR COTTON TIP 6 STRL (MISCELLANEOUS) ×2 IMPLANT
APPLICATOR COTTON TIP 6IN STRL (MISCELLANEOUS) ×3
BAG COUNTER SPONGE SURGICOUNT (BAG) IMPLANT
BAG SPNG CNTER NS LX DISP (BAG)
CATH FOLEY 2WAY SLVR 18FR 30CC (CATHETERS) ×3 IMPLANT
CATH TIEMANN FOLEY 18FR 5CC (CATHETERS) ×3 IMPLANT
CHLORAPREP W/TINT 26 (MISCELLANEOUS) ×3 IMPLANT
CLIP LIGATING HEM O LOK PURPLE (MISCELLANEOUS) ×12 IMPLANT
CNTNR URN SCR LID CUP LEK RST (MISCELLANEOUS) ×2 IMPLANT
CONT SPEC 4OZ STRL OR WHT (MISCELLANEOUS) ×3
COVER SURGICAL LIGHT HANDLE (MISCELLANEOUS) ×3 IMPLANT
COVER TIP SHEARS 8 DVNC (MISCELLANEOUS) ×2 IMPLANT
COVER TIP SHEARS 8MM DA VINCI (MISCELLANEOUS) ×3
CUTTER ECHEON FLEX ENDO 45 340 (ENDOMECHANICALS) ×3 IMPLANT
DERMABOND ADVANCED (GAUZE/BANDAGES/DRESSINGS) ×1
DERMABOND ADVANCED .7 DNX12 (GAUZE/BANDAGES/DRESSINGS) ×2 IMPLANT
DRAIN CHANNEL RND F F (WOUND CARE) IMPLANT
DRAPE ARM DVNC X/XI (DISPOSABLE) ×8 IMPLANT
DRAPE COLUMN DVNC XI (DISPOSABLE) ×2 IMPLANT
DRAPE DA VINCI XI ARM (DISPOSABLE) ×12
DRAPE DA VINCI XI COLUMN (DISPOSABLE) ×3
DRAPE SURG IRRIG POUCH 19X23 (DRAPES) ×3 IMPLANT
DRSG TEGADERM 4X4.75 (GAUZE/BANDAGES/DRESSINGS) ×3 IMPLANT
ELECT PENCIL ROCKER SW 15FT (MISCELLANEOUS) ×1 IMPLANT
ELECT REM PT RETURN 15FT ADLT (MISCELLANEOUS) ×3 IMPLANT
GAUZE 4X4 16PLY ~~LOC~~+RFID DBL (SPONGE) IMPLANT
GAUZE SPONGE 2X2 8PLY STRL LF (GAUZE/BANDAGES/DRESSINGS) IMPLANT
GLOVE BIO SURGEON STRL SZ 6.5 (GLOVE) ×2 IMPLANT
GLOVE BIOGEL PI IND STRL 7.5 (GLOVE) ×2 IMPLANT
GLOVE BIOGEL PI INDICATOR 7.5 (GLOVE) ×5
GLOVE SURG LX 7.5 STRW (GLOVE) ×2
GLOVE SURG LX STRL 7.5 STRW (GLOVE) ×4 IMPLANT
GOWN SRG XL LVL 4 BRTHBL STRL (GOWNS) ×2 IMPLANT
GOWN STRL NON-REIN XL LVL4 (GOWNS) ×15
HOLDER FOLEY CATH W/STRAP (MISCELLANEOUS) ×3 IMPLANT
IRRIG SUCT STRYKERFLOW 2 WTIP (MISCELLANEOUS) ×3
IRRIGATION SUCT STRKRFLW 2 WTP (MISCELLANEOUS) ×2 IMPLANT
IV LACTATED RINGERS 1000ML (IV SOLUTION) ×3 IMPLANT
KIT PROCEDURE DA VINCI SI (MISCELLANEOUS) ×3
KIT PROCEDURE DVNC SI (MISCELLANEOUS) ×2 IMPLANT
KIT TURNOVER KIT A (KITS) IMPLANT
NDL INSUFFLATION 14GA 120MM (NEEDLE) ×2 IMPLANT
NDL SPNL 22GX7 QUINCKE BK (NEEDLE) ×2 IMPLANT
NEEDLE INSUFFLATION 14GA 120MM (NEEDLE) ×3 IMPLANT
NEEDLE SPNL 22GX7 QUINCKE BK (NEEDLE) ×3 IMPLANT
PACK ROBOT UROLOGY CUSTOM (CUSTOM PROCEDURE TRAY) ×3 IMPLANT
PAD POSITIONING PINK XL (MISCELLANEOUS) ×3 IMPLANT
PORT ACCESS TROCAR AIRSEAL 12 (TROCAR) ×2 IMPLANT
PORT ACCESS TROCAR AIRSEAL 5M (TROCAR) ×1
RELOAD STAPLE 45 4.1 GRN THCK (STAPLE) ×2 IMPLANT
SEAL CANN UNIV 5-8 DVNC XI (MISCELLANEOUS) ×8 IMPLANT
SEAL XI 5MM-8MM UNIVERSAL (MISCELLANEOUS) ×12
SET TRI-LUMEN FLTR TB AIRSEAL (TUBING) ×3 IMPLANT
SOLUTION ELECTROLUBE (MISCELLANEOUS) ×3 IMPLANT
SPIKE FLUID TRANSFER (MISCELLANEOUS) ×3 IMPLANT
SPONGE GAUZE 2X2 STER 10/PKG (GAUZE/BANDAGES/DRESSINGS)
SPONGE T-LAP 4X18 ~~LOC~~+RFID (SPONGE) ×3 IMPLANT
STAPLE RELOAD 45 GRN (STAPLE) ×4 IMPLANT
STAPLE RELOAD 45MM GREEN (STAPLE) ×6
SUT ETHIBOND CT1 BRD #0 30IN (SUTURE) ×2 IMPLANT
SUT ETHILON 3 0 PS 1 (SUTURE) ×3 IMPLANT
SUT MNCRL AB 4-0 PS2 18 (SUTURE) ×6 IMPLANT
SUT NOVA NAB GS-21 0 18 T12 DT (SUTURE) ×1 IMPLANT
SUT PDS AB 1 CT1 27 (SUTURE) ×6 IMPLANT
SUT VIC AB 2-0 SH 27 (SUTURE) ×3
SUT VIC AB 2-0 SH 27X BRD (SUTURE) ×2 IMPLANT
SUT VICRYL 0 UR6 27IN ABS (SUTURE) ×3 IMPLANT
SUT VLOC BARB 180 ABS3/0GR12 (SUTURE) ×9
SUTURE VLOC BRB 180 ABS3/0GR12 (SUTURE) ×6 IMPLANT
SYR 27GX1/2 1ML LL SAFETY (SYRINGE) ×3 IMPLANT
SYS BAG RETRIEVAL 10MM (BASKET) ×3
SYSTEM BAG RETRIEVAL 10MM (BASKET) IMPLANT
TOWEL OR NON WOVEN STRL DISP B (DISPOSABLE) ×3 IMPLANT
TROCAR Z-THREAD FIOS 5X100MM (TROCAR) ×1 IMPLANT
WATER STERILE IRR 1000ML POUR (IV SOLUTION) ×3 IMPLANT

## 2022-02-17 NOTE — Plan of Care (Signed)
  Problem: Education: Goal: Knowledge of General Education information will improve Description: Including pain rating scale, medication(s)/side effects and non-pharmacologic comfort measures Outcome: Progressing   Problem: Education: Goal: Required Educational Video(s) Outcome: Progressing   Problem: Clinical Measurements: Goal: Ability to maintain clinical measurements within normal limits will improve Outcome: Progressing

## 2022-02-17 NOTE — Anesthesia Procedure Notes (Signed)
Procedure Name: Intubation Date/Time: 02/17/2022 12:00 PM  Performed by: Maxwell Caul, CRNAPre-anesthesia Checklist: Patient identified, Emergency Drugs available, Suction available and Patient being monitored Patient Re-evaluated:Patient Re-evaluated prior to induction Oxygen Delivery Method: Circle system utilized Preoxygenation: Pre-oxygenation with 100% oxygen Induction Type: IV induction Ventilation: Mask ventilation without difficulty Laryngoscope Size: Mac and 4 Grade View: Grade II Tube type: Oral Tube size: 7.5 mm Number of attempts: 1 Airway Equipment and Method: Stylet Placement Confirmation: ETT inserted through vocal cords under direct vision, positive ETCO2 and breath sounds checked- equal and bilateral Secured at: 21 cm Tube secured with: Tape Dental Injury: Teeth and Oropharynx as per pre-operative assessment

## 2022-02-17 NOTE — Brief Op Note (Signed)
02/17/2022  2:40 PM  PATIENT:  Brian Lane  66 y.o. male  PRE-OPERATIVE DIAGNOSIS:  PROSTATE CANCER  POST-OPERATIVE DIAGNOSIS:  PROSTATE CANCER  PROCEDURE:  Procedure(s) with comments: XI ROBOTIC ASSISTED LAPAROSCOPIC RADICAL PROSTATECTOMY, RIGHT INGUINAL HERNIA REPAIR (N/A) - 3 HRS PELVIC LYMPH NODE DISSECTION (Bilateral)  SURGEON:  Surgeon(s) and Role:    * Alexis Frock, MD - Primary  PHYSICIAN ASSISTANT:   ASSISTANTS: Hinton Rao MD   ANESTHESIA:   local and general  EBL:  50 mL   BLOOD ADMINISTERED:none  DRAINS:  1 - JP to bulb; 2 - Foley to gravity    LOCAL MEDICATIONS USED:  MARCAINE     SPECIMEN:  Source of Specimen:  1 - prostatectomy; 2- pelvic lymph nodes; 3 - periprostatic fat  DISPOSITION OF SPECIMEN:  PATHOLOGY  COUNTS:  YES  TOURNIQUET:  * No tourniquets in log *  DICTATION: .Other Dictation: Dictation Number [id number]  PLAN OF CARE: Admit for overnight observation  PATIENT DISPOSITION:  PACU - hemodynamically stable.   Delay start of Pharmacological VTE agent (>24hrs) due to surgical blood loss or risk of bleeding: yes

## 2022-02-17 NOTE — Transfer of Care (Signed)
Immediate Anesthesia Transfer of Care Note  Patient: Brian Lane  Procedure(s) Performed: XI ROBOTIC ASSISTED LAPAROSCOPIC RADICAL PROSTATECTOMY, RIGHT INGUINAL HERNIA REPAIR (Abdomen) PELVIC LYMPH NODE DISSECTION (Bilateral: Abdomen)  Patient Location: PACU  Anesthesia Type:General  Level of Consciousness: sedated, patient cooperative and responds to stimulation  Airway & Oxygen Therapy: Patient Spontanous Breathing and Patient connected to face mask oxygen  Post-op Assessment: Report given to RN and Post -op Vital signs reviewed and stable  Post vital signs: Reviewed and stable  Last Vitals:  Vitals Value Taken Time  BP 172/133 02/17/22 1451  Temp    Pulse    Resp 25 02/17/22 1453  SpO2    Vitals shown include unvalidated device data.  Last Pain:  Vitals:   02/17/22 0952  TempSrc:   PainSc: 4       Patients Stated Pain Goal: 3 (71/21/97 5883)  Complications: No notable events documented.

## 2022-02-17 NOTE — H&P (Signed)
Brian Lane is an 66 y.o. male.    Chief Complaint: Pre-OP Prostatectomy  HPI:   1 - Low Risk Prostate Cancer - 6/14 cores up to 5% grade 1 cancer on BX 10/2021 at [facility] on eval elevated PSA. Rt and Lt invovlment. TRUS 99m, very very slight early median on prior MRI. He does have significant baseline irritative and obstructive symptoms. [place]   2 - Lower Urinary Tract symptoms - on tamsulsoin + oxybutynin at baseline. Prior TRUS 467m   PMH sig for hepC, HTN, HLD, unknown abd surgery (abtou 3cm supraumbilcal scar ?hernia repair). 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   Today " ErDionis is sseen to proceed with prostatectomy. No interval fevers. Hgb 13.8. BMp Nl. A1c 6.    Past Medical History:  Diagnosis Date   Anxiety    Asthma    Cancer ([place]   [place]   Depression    Dyspnea    History of kidney stones    Hypertension    Pre-diabetes    Tuberculosis     Past Surgical History:  Procedure Laterality Date   CYSTOSCOPY     EYE SURGERY     as a child   ruptured disc      No family history on file. Social History:  reports that he has been smoking cigarettes. He has been smoking an average of .5 packs per day. He has never used smokeless tobacco. He reports that he does not currently use alcohol. He reports current drug use. Frequency: 1.00 time per week. Drug: Heroin.  Allergies: No Known Allergies  No medications prior to admission.    No results found for this or any previous visit (from the past 48 hour(s)). No results found.  Review of Systems  Constitutional:  Negative for chills and fever.  All other systems reviewed and are negative.   There were no vitals taken for this visit. Physical Exam Vitals reviewed.  HENT:     Mouth/Throat:     Mouth: Mucous membranes are moist.  Eyes:     Pupils: Pupils are equal, round, and reactive to light.  Cardiovascular:      Rate and Rhythm: Normal rate.  Abdominal:     General: Abdomen is flat.     Comments: Stable supraumbilcal scar w/o hernias.   Neurological:     General: No focal deficit present.     Mental Status: He is alert.  Psychiatric:        Mood and Affect: Mood normal.      Assessment/Plan  Proceed with prostatectomy / limited node dissection for prostate cancer with obstructive symptoms. Risks, benefits, alternatives, expected peri-op course discussed previously and reiterated today.   ThAlexis FrockMD 02/17/2022, 7:56 AM

## 2022-02-17 NOTE — Anesthesia Preprocedure Evaluation (Addendum)
Anesthesia Evaluation  Patient identified by MRN, date of birth, ID band Patient awake    Reviewed: Allergy & Precautions, NPO status , Patient's Chart, lab work & pertinent test results  History of Anesthesia Complications Negative for: history of anesthetic complications  Airway Mallampati: II  TM Distance: >3 FB Neck ROM: Full    Dental  (+) Dental Advisory Given, Missing   Pulmonary asthma , COPD, Current Smoker and Patient abstained from smoking.,    Pulmonary exam normal        Cardiovascular hypertension, Normal cardiovascular exam     Neuro/Psych Anxiety Depression negative neurological ROS     GI/Hepatic GERD  ,(+) Cirrhosis       , Hepatitis -, C  Endo/Other  negative endocrine ROS  Renal/GU negative Renal ROS   Prostate cancer    Musculoskeletal  (+) narcotic dependent  Abdominal   Peds  Hematology negative hematology ROS (+)   Anesthesia Other Findings Methadone  Reproductive/Obstetrics                          Anesthesia Physical Anesthesia Plan  ASA: 3  Anesthesia Plan: General   Post-op Pain Management: Toradol IV (intra-op)*   Induction: Intravenous  PONV Risk Score and Plan: Ondansetron, Dexamethasone, Treatment may vary due to age or medical condition and Midazolam  Airway Management Planned: Oral ETT  Additional Equipment: None  Intra-op Plan:   Post-operative Plan: Extubation in OR  Informed Consent:   Plan Discussed with:   Anesthesia Plan Comments:         Anesthesia Quick Evaluation

## 2022-02-17 NOTE — Discharge Instructions (Signed)

## 2022-02-17 NOTE — Op Note (Signed)
NAME: Brian Lane, Brian Lane MEDICAL RECORD NO: [id number] ACCOUNT NO: [id number] DATE OF BIRTH: 06/27/1956 FACILITY: [facility] LOCATION: [facility] PHYSICIAN: Alexis Frock, MD  Operative Report   DATE OF PROCEDURE: 02/17/2022  PREOPERATIVE DIAGNOSIS:  Prostate cancer with obstructive voiding.  PROCEDURES: 1.  Robotic-assisted laparoscopic radical prostatectomy. 2.  Bilateral pelvic lymph node dissection. 3.  Laparoscopic right inguinal hernia repair.  POSTOPERATIVE DIAGNOSIS:  Prostate cancer with obstructive voiding plus right inguinal hernia x2, one indirect and one direct.  DRAINS:   1.  Jackson-Pratt drain to bulb suction. 2.  Foley catheter change.  SPECIMENS:   1.  Radical prostatectomy. 2.  Right external iliac lymph nodes. 3.  Right obturator lymph nodes. 4.  Left external iliac lymph nodes. 5. Left obturator lymph nodes. 6.  Periprostatic fat.  ASSISTANTS:  Hinton Rao, MD  ESTIMATED BLOOD LOSS:  50 mL.  INDICATIONS:  This is a pleasant 66 year old man followed by the [place].  He was found on workup of elevated PSA to have multifocal relatively low risk adenocarcinoma of the prostate.  He also has significant history of obstructive and irritative voiding  symptoms and has been on medical therapy for this for quite some time with progression of symptoms.  He does have a median lobe type anatomy.  Options were discussed for definitive management including surveillance protocols versus ablative therapy  versus surgical exploration and given the patient's significant baseline obstructive symptoms we will proceed with prostatectomy with curative intent with cancer and his obstructive voiding.  Informed consent was obtained and placed in medical record.  PROCEDURE IN DETAIL:  The patient is being identified and verified.  Procedure being radical prostatectomy was confirmed.  Procedure timeout was performed. Intravenous antibiotics were administered.  General endotracheal anesthesia  induced.  The patient  was placed into a low lithotomy position.  Sterile field was created, prepped and draped the patient's penis, perineum, and proximal thighs using iodine and his infra-xiphoid abdomen using chlorhexidine gluconate after clipper shaving and further  fastening to operative table using 3-inch tape over foam padding across supraxiphoid chest.  Foley catheter was placed per urethra to straight drain.  The patient has a history of supraumbilical hernia repair of the scar in this location.  Therefore,  pneumoperitoneum was obtained using Veress technique in the right hemiabdomen approximately 4 fingerbreadths lateral to the umbilicus an area well away from his prior hernia repair.  An 8 mm robotic camera port was then placed in the location.   Laparoscopic examination of the perineum revealed no significant adhesions, no visceral injury.  These what appeared to be a preperitoneal small piece of mesh in the area of prior hernia repair, but no significant adhesions within the abdomen is quite  favorable.  Additional ports were placed as follows, supraumbilical 8 mm robotic port, left paramedian 8 mm robotic port, left far lateral 8 mm robotic port, right far lateral 12 mm AirSeal assist port, right paramedian 5 mm suction port.  Robot was  docked and passed the electronic checks.  Initial attention was directed at development of space of Retzius.  Incision was made lateral to the right median umbilical ligament from the midline towards the area of the internal ring coursing along the iliac  vessels towards the right ureter, which was positively identified.  The right vas deferens was encountered, ligated using medial bucket handle and the right bladder wall swept away from the pelvic sidewall towards the endopelvic fascia on the right  side.  A mirror image dissection was  performed on the left side.  Anterior attachments were taken down with cautery scissors to expose the anterior base of the  prostate, which was defatted to better denote the bladder neck prostate junction.  This tissue  was set aside labeled as periprostatic fat.  Next, the endopelvic fascia was swept away from the lateral aspect of the prostate in base to apex orientation.  This exposed the dorsal venous complex, which was controlled using green load stapler, taking  exquisite care to avoid membranous urethral injury, which did not occur.  Attention was directed at pelvic lymphadenectomy first on the right side.  All fibrofatty tissue within the boundaries of the right external iliac artery, vein, pelvic sidewall was  dissected free.  Lymphostasis was achieved with cold clips set aside labeled as right external iliac lymph nodes.  Next, right obturator group was dissected free with the boundaries being right external iliac vein, pelvic sidewall, obturator nerve to  the side of the right obturator lymph nodes.  The obturator nerve was inspected following maneuvers and found to be uninjured.  A mirror image of lymph node dissection was performed on the left side of left external iliac and left obturator group  respectively and similarly the left obturator nerve was inspected following maneuvers and found to be uninjured.  Attention was directed at bladder neck dissection.  Bladder was identified moving the Foley catheter back and forth and a lateral release  was performed on each side to better denote the bladder neck caliber.  The patient does have a known small median lobe.  The bladder neck was then separated from the prostate in anterior, posterior direction given what appeared to be a rim of circular  muscle fibers each plane of dissection.  The median lobe was present, but not very large.  He did not require formal stay stitch manipulation.  Posterior dissection was performed by incising approximately 7 mm inferior posterior to the posterior lip of  the prostate entering the plane of Denonvilliers.  Bilateral vas deferens  were encountered, dissected for a distance of approximately 4 cm, ligated, and placing on gentle superior traction.  Bilateral seminal vesicles were dissected to their tip and  placing in gentle superior traction.  Dissection then proceeded inferiorly in plane of Denonvilliers towards the area of the apex of the prostate.  This exposed the vascular pedicles were controlled using a sequential clipping technique in both sides  performing moderate nerve sparing in the process.  Final apical dissection was performed in the anterior approach placing the prostate on gentle superior traction, transecting the membranous urethra coldly.  This completely freed up the prostatectomy  specimen was placed in an EndoCatch bag for later retrieval.  Digital rectal exam was performed using indicator glove under laparoscopic vision.  No evidence of rectal violation was noted.  Posterior reconstruction was performed using a 3-0 V-Loc suture,  reapproximating the posterior urethral plate, the posterior bladder neck, bringing the structures in a tension-free apposition.  Next mucosa-to-mucosa anastomosis was performed using double armed 3-0 suture, resulting in excellent tension-free  apposition.  I was quite happy with the level of the bladder neck preservation.  The patient did complain of some right inguinal swelling preoperatively.  Inspection of the abdomen, intraoperatively did reveal actually 2 significant inguinal hernias on  the right side, one being a direct type, just above the pubic ramus defect approximately 2 cm and the other being indirect type at the level of the internal ring defect being approximately 3 cm.  The direct hernia defect was repaired at the level of  fascia using figure-of-eight Ethibond, which showed an excellent simple closure with this, the indirect inguinal hernia was closed using figure-of-eight Ethibond x2, reapproximating the fascial defect which  resulted in excellent resolution of the hernia  defect with a  simple closure.  We achieved the goals extirpative portions of the surgery today.  Sponge and needle counts were correct.  Hemostasis was excellent.  A new Foley catheter was placed, which irrigated quantitatively.  Closed suction drain was brought out  the previous left lateral most robotic port site near the peritoneal cavity.  Robot was then undocked.  Specimen was retrieved by extending the previous camera port site inferiorly tearing on the left side of the umbilicus.  This was approximately 3 cm,  removing the prostatectomy specimen setting aside for permanent pathology.  Extraction site was closed with fascia using Novafil x4.  Given his history of hernias, followed by reapproximation of Scarpa's using running Vicryl.  All incision sites were  infiltrated with dilute lipolyzed Marcaine and closed at the level of the skin using subcuticular Monocryl followed by Dermabond.  Procedure was then terminated.  The patient tolerated the procedure well, no immediate periprocedural complications.  The  patient was taken to postanesthesia care in stable condition.   PUS D: 02/17/2022 2:49:38 pm T: 02/17/2022 3:37:00 pm  JOB: [id number]/ [id number]

## 2022-02-18 ENCOUNTER — Encounter (HOSPITAL_COMMUNITY): Payer: Self-pay | Admitting: Urology

## 2022-02-18 ENCOUNTER — Other Ambulatory Visit: Payer: Self-pay

## 2022-02-18 DIAGNOSIS — C61 Malignant neoplasm of prostate: Secondary | ICD-10-CM | POA: Diagnosis not present

## 2022-02-18 LAB — HEMOGLOBIN AND HEMATOCRIT, BLOOD
HCT: 34.5 % — ABNORMAL LOW (ref 39.0–52.0)
Hemoglobin: 11.5 g/dL — ABNORMAL LOW (ref 13.0–17.0)

## 2022-02-18 LAB — BASIC METABOLIC PANEL
Anion gap: 10 (ref 5–15)
BUN: 18 mg/dL (ref 8–23)
CO2: 26 mmol/L (ref 22–32)
Calcium: 8.9 mg/dL (ref 8.9–10.3)
Chloride: 100 mmol/L (ref 98–111)
Creatinine, Ser: 1.18 mg/dL (ref 0.61–1.24)
GFR, Estimated: >60 mL/min (ref >60)
Glucose, Bld: 168 mg/dL — ABNORMAL HIGH (ref 70–99)
Potassium: 4 mmol/L (ref 3.5–5.1)
Sodium: 136 mmol/L (ref 135–145)

## 2022-02-18 MED ORDER — ACETAMINOPHEN 500 MG PO TABS
1000.0000 mg | ORAL_TABLET | Freq: Four times a day (QID) | ORAL | Status: AC
  Administered 2022-02-18: 1000 mg via ORAL
  Filled 2022-02-18 (×2): qty 2

## 2022-02-18 MED ORDER — SIMETHICONE 80 MG PO CHEW
80.0000 mg | CHEWABLE_TABLET | Freq: Four times a day (QID) | ORAL | Status: DC
  Administered 2022-02-18 – 2022-02-19 (×4): 80 mg via ORAL
  Filled 2022-02-18 (×5): qty 1

## 2022-02-18 MED ORDER — METHADONE HCL 10 MG/ML PO CONC: 30.0000 mg | Freq: Every day | ORAL | Status: DC

## 2022-02-18 MED ORDER — CHLORHEXIDINE GLUCONATE CLOTH 2 % EX PADS
6.0000 | MEDICATED_PAD | Freq: Every day | CUTANEOUS | Status: DC
  Administered 2022-02-18 – 2022-02-19 (×2): 6 via TOPICAL

## 2022-02-18 MED ORDER — METHADONE HCL 10 MG PO TABS
30.0000 mg | ORAL_TABLET | Freq: Every day | ORAL | Status: DC
  Administered 2022-02-18 – 2022-02-19 (×2): 30 mg via ORAL
  Filled 2022-02-18 (×2): qty 3

## 2022-02-18 NOTE — Progress Notes (Addendum)
Pt c/o 10/10 pain. Attempted to give scheduled tylenol, but pt refused stating that he has thrown up every pill previously received. Administered scheduled zofran and attempted to give pt PRN pain medication through IV. Pt stated "Are you trying to make me a junkie?" Assured pt that the IV pain med was to help control pain. Pt stated that nothing has helped him all night and there is no point in giving him things that do not work. Nurse offered PO pain medication, but pt refused. Call light within reach and bed alarm on.

## 2022-02-18 NOTE — Progress Notes (Signed)
Pt tossing and turning in bed, pulling at lines, and complaining of 10/10 pain. JP drain dressing saturated. Dressing was reinforced and drain was emptied. Gave pt PRN pain medication and repositioned. Call light placed next to patient and bed alarm activated.

## 2022-02-18 NOTE — Discharge Summary (Signed)
[facility] Discharge Summary  Admit date: 02/17/2022  Discharge date and time: 02/19/22   Discharge to: Home  Discharge Service: Urology  Discharge Attending Physician:  Dr. Tresa Moore  Discharge  Diagnoses: Prostate cancer [facility])  Secondary Diagnosis: Principal Problem:   Prostate cancer ([place])   OR Procedures: Procedure(s): XI ROBOTIC ASSISTED LAPAROSCOPIC RADICAL PROSTATECTOMY, RIGHT INGUINAL HERNIA REPAIR PELVIC LYMPH NODE DISSECTION 02/17/2022   Ancillary Procedures: None   Discharge Day Services: The patient was seen and examined by the Urology team both in the morning and immediately prior to discharge.  Vital signs and laboratory values were stable and within normal limits.  The physical exam was benign and unchanged and all surgical wounds were examined.  Discharge instructions were explained and all questions answered.  Subjective  No acute events overnight. Pain Controlled. No fever or chills.  Objective Patient Vitals for the past 8 hrs:  BP Temp Temp src Pulse Resp SpO2  02/19/22 1215 116/73 98.8 F (37.1 C) Oral 72 13 99 %  02/19/22 0929 127/80 -- -- -- -- --   Total I/O In: 600 [P.O.:600] Out: 450 [Urine:400; Drains:50]  General Appearance:        No acute distress Lungs:                       Normal work of breathing on room air Heart:                                Regular rate and rhythm Abdomen:                         Soft, non-tender, non-distended. Incisions clean/dry/intact. Foley draining clear yellow urine Extremities:                      Warm and well perfused   Hospital Course:  The patient underwent robotic assisted radical prostatectomy on 02/17/2022.  The patient tolerated the procedure well, was extubated in the OR, and afterwards was taken to the PACU for routine post-surgical care. When stable the patient was transferred to the floor.   Initially had some difficulty with nausea and pain, which had improved significantly by post operative day  1.  The patient's diet was slowly advanced and at the time of discharge was tolerating a regular diet.  The patient was discharged home 2 Days Post-Op, at which point was tolerating a regular solid diet, was able to void spontaneously, have adequate pain control with P.O. pain medication, and could ambulate without difficulty. His methadone was restarted while in house at 1/2 his normal dose. The patient will follow up with [place] for post op check.   Condition at Discharge: Improved  Discharge Medications:  Allergies as of 02/19/2022   No Known Allergies      Medication List     STOP taking these medications    tamsulosin 0.4 MG Caps capsule Commonly known as: FLOMAX       TAKE these medications    albuterol 108 (90 Base) MCG/ACT inhaler Commonly known as: VENTOLIN HFA Inhale 2 puffs into the lungs every 4 (four) hours as needed for wheezing or shortness of breath.   amLODipine 5 MG tablet Commonly known as: NORVASC Take 5 mg by mouth daily.   atorvastatin 40 MG tablet Commonly known as: LIPITOR TAKE ONE TABLET BY MOUTH AT BEDTIME FOR CHOLESTEROL.  NOTIFY CLINIC FOR UNUSUAL MUSCLE ACHES OR WEAKNESS, BROWN URINE, BLOOD IN  URINE, YELLOWING OF SKIN/WHITES OF EYES, PERSISTENT  NAUSEA/VOMITING/DIARRHEA FOR CHOLESTEROL.  NOTIFY CLINIC FOR UNUSUAL MUSCLE ACHES OR WEAKNESS, BROWN URINE, BLOOD IN  URINE, YELLOWING OF SKIN/WHITES OF EYES, PERSISTENT  NAUSEA/VOMITING/DIARRHEA   busPIRone 7.5 MG tablet Commonly known as: BUSPAR Take 7.5 mg by mouth 2 (two) times daily.   cholecalciferol 25 MCG (1000 UNIT) tablet Commonly known as: VITAMIN D3 Take 1,000 Units by mouth daily.   ibuprofen 200 MG tablet Commonly known as: ADVIL Take 400 mg by mouth every 8 (eight) hours as needed for moderate pain.   Linzess 145 MCG Caps capsule Generic drug: linaclotide Take 145 mcg by mouth every morning.   methadone 10 MG/ML solution Commonly known as: DOLOPHINE Take 60 mg by mouth daily.    oxyCODONE-acetaminophen 5-325 MG tablet Commonly known as: Percocet Take 1-2 tablets by mouth every 6 (six) hours as needed for severe pain or moderate pain (post-operatively.).

## 2022-02-18 NOTE — Progress Notes (Signed)
Urology Progress Note   1 Day Post-Op from robotic prostatectomy.   Subjective: NAEON. Some post operative pain/nausea that has significantly improved this morning. Tolerating regular diet. Hgb 11.5 from PACU of 14.2. Vitals stable.  Objective: Vital signs in last 24 hours: Temp:  [97.4 F (36.3 C)-99.6 F (37.6 C)] 98 F (36.7 C) (07/06 0830) Pulse Rate:  [72-98] 95 (07/06 0830) Resp:  [12-20] 18 (07/06 0830) BP: (145-193)/(90-133) 145/90 (07/06 0830) SpO2:  [97 %-100 %] 100 % (07/06 0830)  Intake/Output from previous day: 07/05 0701 - 07/06 0700 In: 4135 [P.O.:480; I.V.:2555; IV Piggyback:1100] Out: 3110 [Urine:2850; Drains:210; Blood:50] Intake/Output this shift: No intake/output data recorded.  Physical Exam:  General: Alert and oriented CV: Regular rate Lungs: No increased work of breathing Abdomen:  Soft, appropriately tender. Incisions c/d/i. JP SS GU: Foley in place draining clear yellow urine  Ext: NT, No erythema  Lab Results: Recent Labs    02/17/22 1637 02/18/22 0530  HGB 14.2 11.5*  HCT 43.1 34.5*   Recent Labs    02/18/22 0530  NA 136  K 4.0  CL 100  CO2 26  GLUCOSE 168*  BUN 18  CREATININE 1.18  CALCIUM 8.9    Studies/Results: No results found.  Assessment/Plan:  66 y.o. male s/p robotic prostatectomy, POD1.  Overall doing well post-op.   - Regular diet - Methadone 1/2 home dose - Tylenol, oxy prn pain control - Continue foley - IV lock  Dispo: floor, anticipate home tomorrow.   LOS: 0 days

## 2022-02-18 NOTE — Progress Notes (Signed)
Pt c/o anxiety as well as 10/10 pain. Urologist on call contacted. Instructed to give scheduled buspar for anxiety. Gave to PRN pain medication and buspar to patient, repositioned. Call light within reach and bed alarm on.

## 2022-02-18 NOTE — TOC Progression Note (Signed)
Transition of Care [facility]) - Progression Note    Patient Details  Name: Brian Lane MRN: [id number] Date of Birth: May 04, 1956  Transition of Care [facility] CM/SW Contact  Joaquin Courts, RN Phone Number: 02/18/2022, 8:58 AM    Transition of Care (TOC) Screening Note   Patient Details  Name: Brian Lane Date of Birth: 03/01/1956   Transition of Care [facility]) CM/SW Contact:    Joaquin Courts, RN Phone Number: 02/18/2022, 8:58 AM    Transition of Care Department [facility]) has reviewed patient and no TOC needs have been identified at this time. We will continue to monitor patient advancement through interdisciplinary progression rounds. If new patient transition needs arise, please place a TOC consult.

## 2022-02-18 NOTE — Anesthesia Postprocedure Evaluation (Signed)
Anesthesia Post Note  Patient: Brian Lane  Procedure(s) Performed: XI ROBOTIC ASSISTED LAPAROSCOPIC RADICAL PROSTATECTOMY, RIGHT INGUINAL HERNIA REPAIR (Abdomen) PELVIC LYMPH NODE DISSECTION (Bilateral: Abdomen)     Patient location during evaluation: PACU Anesthesia Type: General Level of consciousness: awake and alert Pain management: pain level controlled Vital Signs Assessment: post-procedure vital signs reviewed and stable Respiratory status: spontaneous breathing, nonlabored ventilation, respiratory function stable and patient connected to nasal cannula oxygen Cardiovascular status: blood pressure returned to baseline and stable Postop Assessment: no apparent nausea or vomiting Anesthetic complications: no   No notable events documented.  Last Vitals:  Vitals:   02/18/22 0019 02/18/22 0441  BP: (!) 151/112 (!) 150/101  Pulse: 98 94  Resp: 16 18  Temp: 36.7 C 37.6 C  SpO2:  100%    Last Pain:  Vitals:   02/18/22 0745  TempSrc:   PainSc: 4                  Maddelynn Moosman L Tatum Massman

## 2022-02-18 NOTE — Progress Notes (Signed)
Attempted to ambulate pt. Pt ambulated 82f in hallway before yelling out and asking to return to bed. Assisted pt back to bed. Call light within reach and bed alarm on.

## 2022-02-19 DIAGNOSIS — C61 Malignant neoplasm of prostate: Secondary | ICD-10-CM | POA: Diagnosis not present

## 2022-02-19 MED ORDER — OXYCODONE-ACETAMINOPHEN 5-325 MG PO TABS: 1.0000 | ORAL_TABLET | Freq: Four times a day (QID) | ORAL | 0 refills | Status: AC | PRN

## 2022-02-19 NOTE — Progress Notes (Signed)
Patient C/O SOB, oxygen saturation 98% on room air, encouraged patient to use his incentive spirometry, placed patient on 1L of oxygen and he stated he feels much better. Will continue to assess patient, reported off to next shift RN to F/U with plan of care.

## 2022-02-19 NOTE — Progress Notes (Signed)
Patient has been read and explained discharge instructions. Patient states he has no further questions at this time. IV of the left and right arm have been removed. Sites are clean, dry and intact.

## 2022-02-19 NOTE — Progress Notes (Signed)
Patient educated on foley care/management and leg bag. Patient verbalized understanding. Might need reinforcement.

## 2022-02-22 LAB — SURGICAL PATHOLOGY

## 2022-02-24 ENCOUNTER — Encounter (HOSPITAL_COMMUNITY): Payer: Self-pay

## 2022-02-24 ENCOUNTER — Emergency Department (HOSPITAL_COMMUNITY)
Admission: EM | Admit: 2022-02-24 | Discharge: 2022-02-24 | Discharge status: Against Medical Advice | Payer: No Typology Code available for payment source | Attending: Emergency Medicine | Admitting: Emergency Medicine

## 2022-02-24 DIAGNOSIS — N4829 Other inflammatory disorders of penis: Secondary | ICD-10-CM | POA: Insufficient documentation

## 2022-02-24 DIAGNOSIS — Z5321 Procedure and treatment not carried out due to patient leaving prior to being seen by health care provider: Secondary | ICD-10-CM | POA: Insufficient documentation

## 2022-02-24 LAB — URINALYSIS, ROUTINE W REFLEX MICROSCOPIC
Bilirubin Urine: NEGATIVE
Glucose, UA: NEGATIVE mg/dL
Ketones, ur: NEGATIVE mg/dL
Nitrite: NEGATIVE
Protein, ur: 30 mg/dL — AB
RBC / HPF: >50 RBC/hpf — ABNORMAL HIGH (ref 0–5)
Specific Gravity, Urine: 1.02 (ref 1.005–1.030)
pH: 7 (ref 5.0–8.0)

## 2022-02-24 NOTE — ED Triage Notes (Signed)
Pt states that he has been having to self-cath since his prostate surgery on 7/5. Pt states that since being home he has been having increasing penile pain. Pt denies hematuria, penile discharge, fevers.

## 2022-02-24 NOTE — ED Provider Triage Note (Signed)
Emergency Medicine Provider Triage Evaluation Note  Brian Lane , a 66 y.o. male  was evaluated in triage.  Pt complains of penile swelling.  Patient states that he had a "prostate removal" about 1 week ago.  Records show radical prostatectomy performed by Dr. Tresa Moore on 02/17/2022.  Since that time, patient reports penile swelling.  He has not called his urologist about this.  He has been having normal urine flow through his catheter.  He does report having some constipation and used a laxative yesterday.  He reports spontaneous bowel movement this morning.  He is concerned about an infection.  No reported fevers.  Review of Systems  Positive: Swelling Negative: Fever  Physical Exam  BP 116/65 (BP Location: Right Arm)   Pulse 70   Temp 98.7 F (37.1 C) (Oral)   Resp 18   SpO2 90%  Gen:   Awake, no distress   Resp:  Normal effort  MSK:   Moves extremities without difficulty  Other:    Medical Decision Making  Medically screening exam initiated at 3:22 PM.  Appropriate orders placed.  [name] was informed that the remainder of the evaluation will be completed by another provider, this initial triage assessment does not replace that evaluation, and the importance of remaining in the ED until their evaluation is complete.     Carlisle Cater, PA-C 02/24/22 1523

## 2022-06-24 LAB — COLOGUARD: COLOGUARD: NEGATIVE

## 2022-06-24 LAB — EXTERNAL GENERIC LAB PROCEDURE: COLOGUARD: NEGATIVE

## 2022-09-12 IMAGING — XA DG MYELOGRAPHY LUMBAR INJ LUMBOSACRAL
14 of 19 series · 14 of 19 positions shown · non-contrast
Comparison: Outside facility lumbar spine MRI from 03/24/2020 and
lumbar spine radiographs from 07/29/2020

CLINICAL DATA: 64-year-old male with history of lower back pain
radiating to the right lower extremity. Presents for lumbar
myelogram.
TECHNIQUE: Contiguous axial images were obtained through the Lumbar spine after
the intrathecal infusion of infusion. Coronal and sagittal
reconstructions were obtained of the axial image sets.

[Series 1: vasc standard · 1 of 1 slices shown (1 of 13)]
[im 1/1]
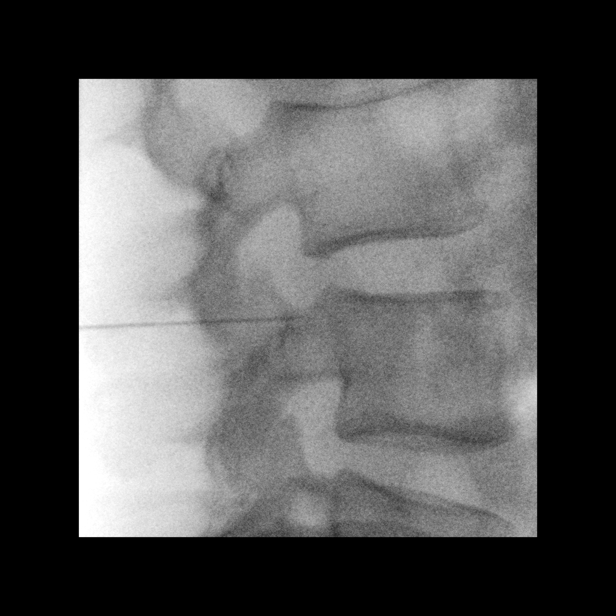

[Series 2: w lumbar spine flexion · 0.15mm/px · 1 of 1 slices shown]
[im 1/1]
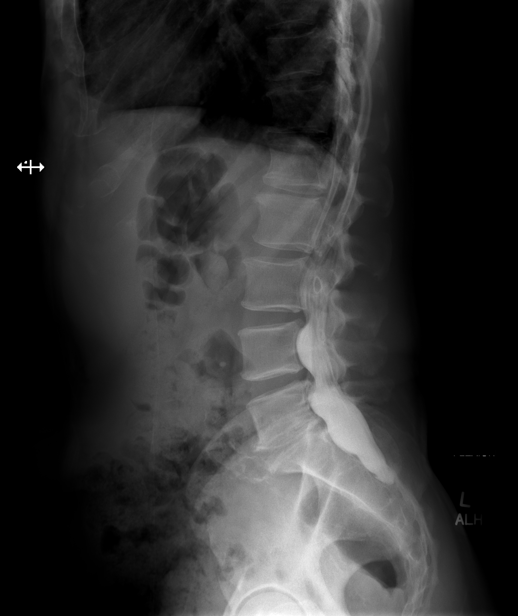

[Series 2: vasc standard · 1 of 1 slices shown (2 of 13)]
[im 1/1]
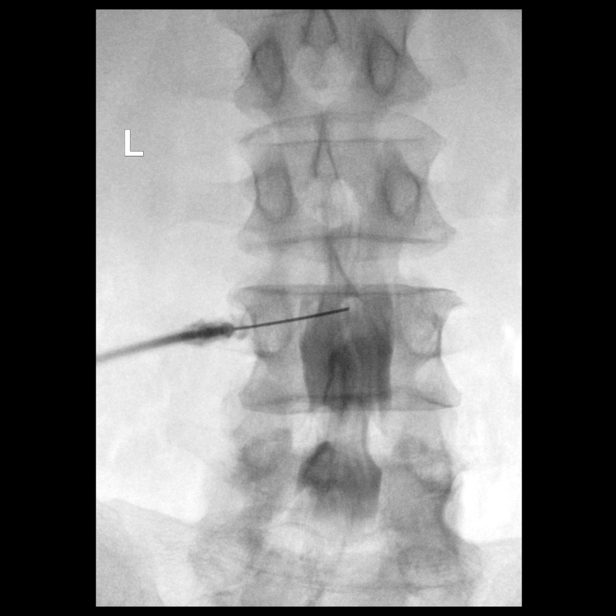

[Series 3: vasc standard · 1 of 1 slices shown (3 of 13)]
[im 1/1]
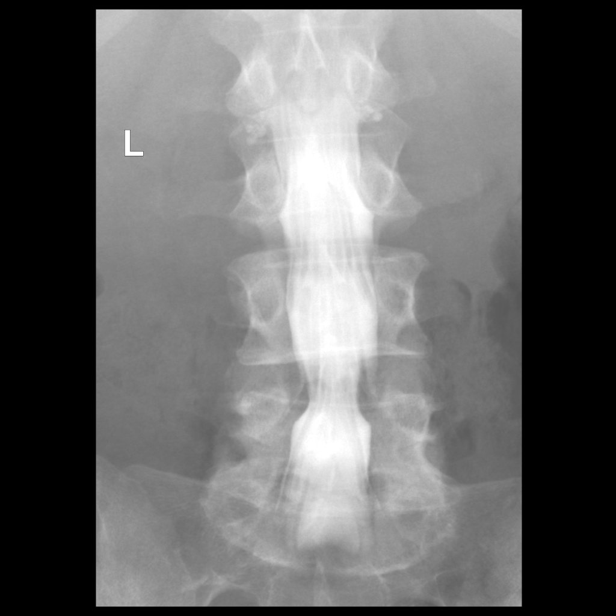

[Series 4: vasc standard · 1 of 1 slices shown (4 of 13)]
[im 1/1]
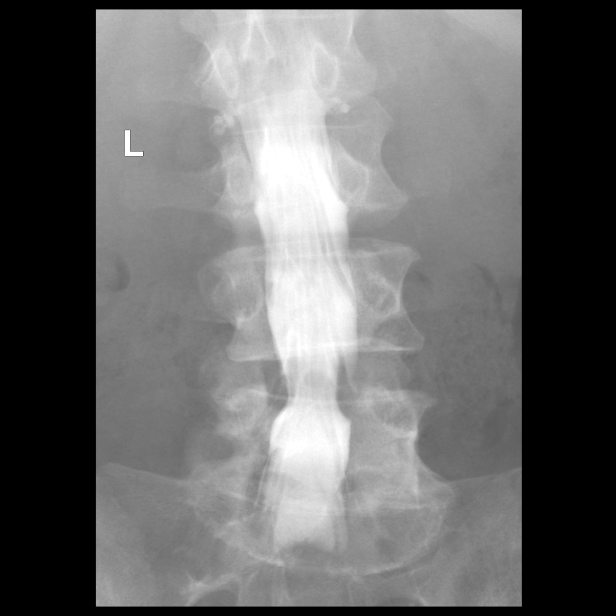

[Series 5: vasc standard · 1 of 1 slices shown (5 of 13)]
[im 1/1]
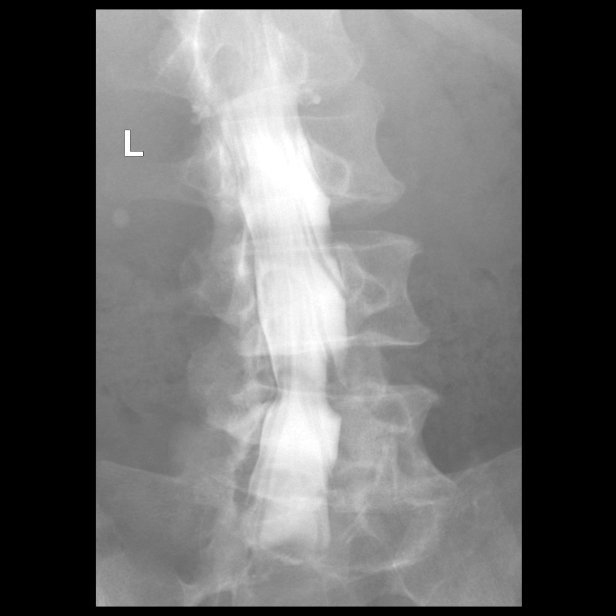

[Series 6: vasc standard · 1 of 1 slices shown (6 of 13)]
[im 1/1]
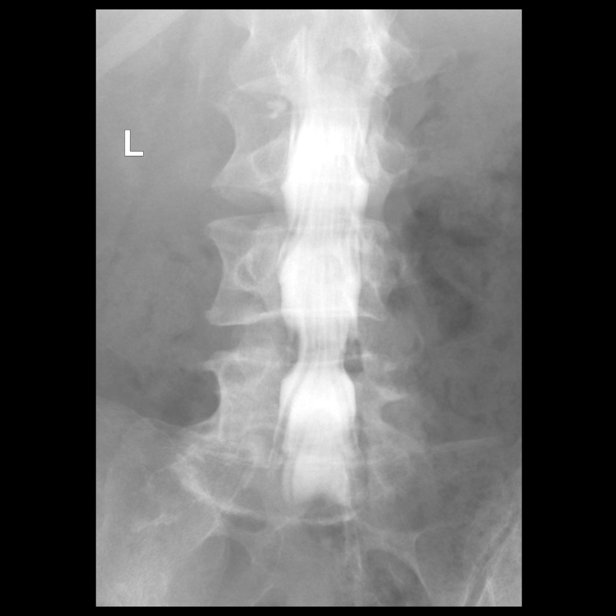

[Series 8: vasc standard · 1 of 1 slices shown (7 of 13)]
[im 1/1]
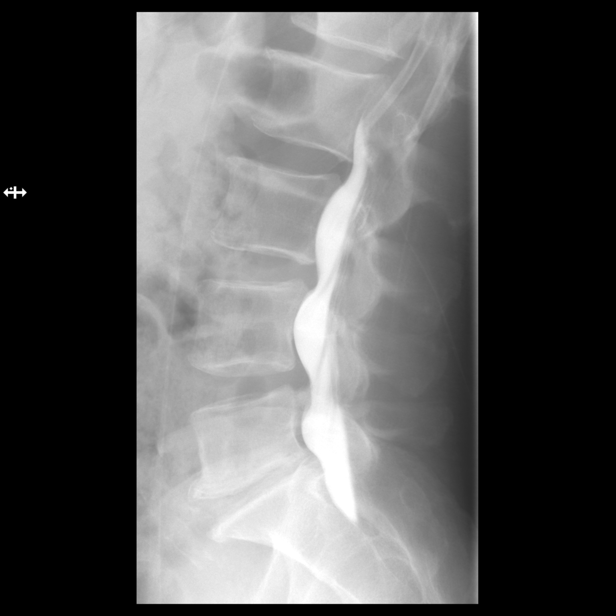

[Series 9: vasc standard · 1 of 1 slices shown (8 of 13)]
[im 1/1]
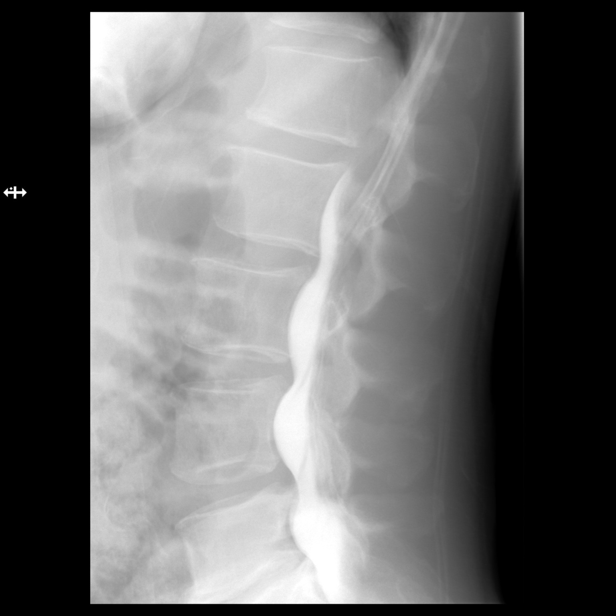

[Series 10: vasc standard · 1 of 1 slices shown (9 of 13)]
[im 1/1]
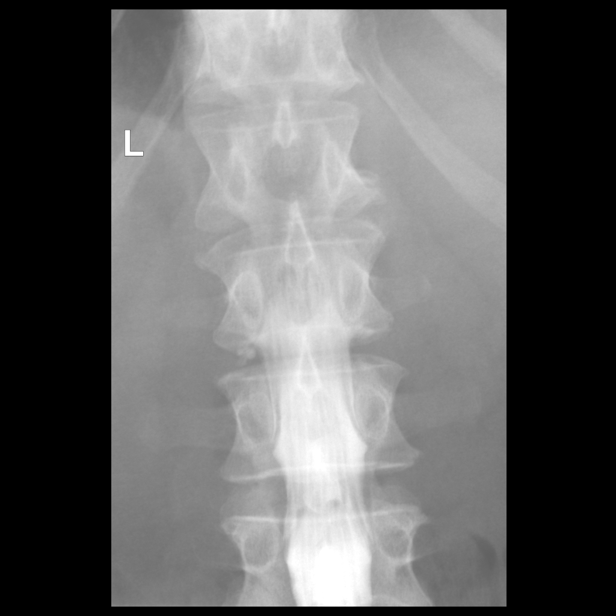

[Series 12: vasc standard · 1 of 1 slices shown (10 of 13)]
[im 1/1]
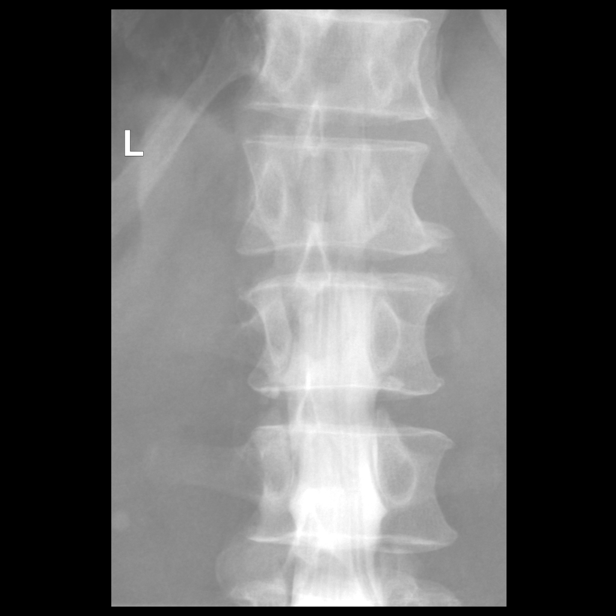

[Series 13: vasc standard · 1 of 1 slices shown (11 of 13)]
[im 1/1]
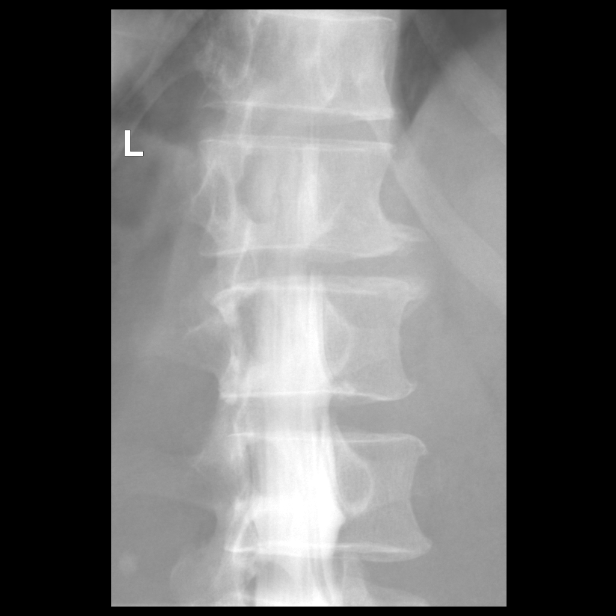

[Series 14: vasc standard · 1 of 1 slices shown (12 of 13)]
[im 1/1]
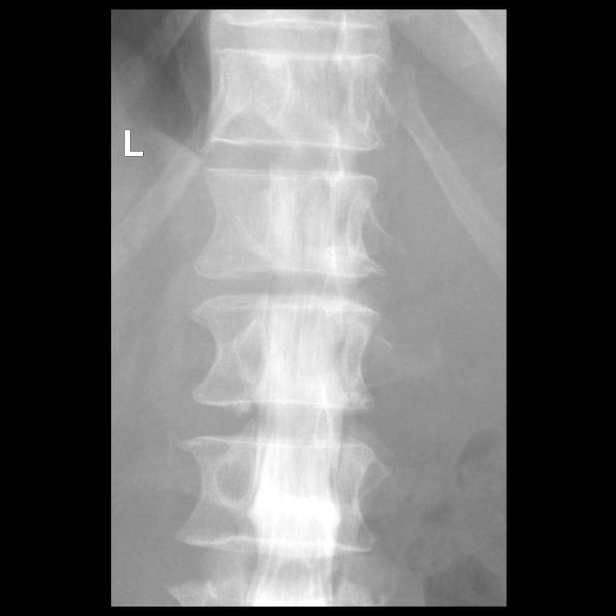

[Series 16: vasc standard · 1 of 1 slices shown (13 of 13)]
[im 1/1]
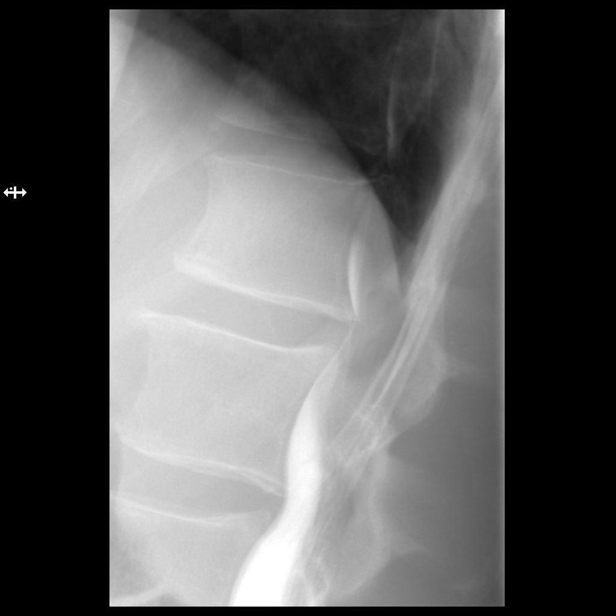

[14 of 19 positions shown; findings below may reference images not displayed]

EXAM:
LUMBAR MYELOGRAM

FLUOROSCOPY TIME:  1 minutes, 2 seconds.  14.3 mGy.

PROCEDURE:
After thorough discussion of risks and benefits of the procedure
including bleeding, infection, injury to nerves, blood vessels,
adjacent structures as well as headache and CSF leak, written and
oral informed consent was obtained. Consent was obtained by Dr.
Jyri-Jussi Inermo. Time out form was completed.

Patient was positioned prone on the fluoroscopy table. Local
anesthesia was provided with 1% lidocaine without epinephrine after
prepped and draped in the usual sterile fashion. Puncture was
performed at L3-L4 using a 3 1/2 inch 22-gauge spinal needle via
left paramedian approach. Using a single pass through the dura, the
needle was placed within the thecal sac, with return of clear CSF.
15 mL of Isovue H-B00 was injected into the thecal sac, with normal
opacification of the nerve roots and cauda equina consistent with
free flow within the subarachnoid space.

I personally performed the lumbar puncture and administered the
intrathecal contrast. I also personally supervised acquisition of
the myelogram images.
FINDINGS: LUMBAR MYELOGRAM FINDINGS:

Intrathecal contrast flows freely through the lumbar thecal sac.
There is moderate disc bulge at L4-L5. No evidence of dynamic
instability on flexion-extension views.
IMPRESSION: LUMBAR MYELOGRAM IMPRESSION:

Technically successful fluoroscopic guided myelogram via a left
paramedian approach at L3-L4. Please refer to the separately
dictated CT lumbar myelogram for level by level findings.

## 2022-11-29 ENCOUNTER — Ambulatory Visit: Payer: 59 | Admitting: Podiatry

## 2022-11-29 DIAGNOSIS — Z5900 Homelessness unspecified: Secondary | ICD-10-CM | POA: Insufficient documentation

## 2022-11-29 DIAGNOSIS — F1120 Opioid dependence, uncomplicated: Secondary | ICD-10-CM | POA: Insufficient documentation

## 2022-11-29 DIAGNOSIS — Z59811 Housing instability, housed, with risk of homelessness: Secondary | ICD-10-CM | POA: Insufficient documentation

## 2022-11-29 DIAGNOSIS — D509 Iron deficiency anemia, unspecified: Secondary | ICD-10-CM | POA: Insufficient documentation

## 2022-11-29 DIAGNOSIS — F333 Major depressive disorder, recurrent, severe with psychotic symptoms: Secondary | ICD-10-CM | POA: Insufficient documentation

## 2022-11-29 DIAGNOSIS — Z122 Encounter for screening for malignant neoplasm of respiratory organs: Secondary | ICD-10-CM | POA: Insufficient documentation

## 2022-11-29 DIAGNOSIS — F331 Major depressive disorder, recurrent, moderate: Secondary | ICD-10-CM | POA: Insufficient documentation

## 2022-11-29 DIAGNOSIS — H6123 Impacted cerumen, bilateral: Secondary | ICD-10-CM | POA: Insufficient documentation

## 2022-11-29 DIAGNOSIS — F1990 Other psychoactive substance use, unspecified, uncomplicated: Secondary | ICD-10-CM | POA: Insufficient documentation

## 2022-11-29 DIAGNOSIS — F17201 Nicotine dependence, unspecified, in remission: Secondary | ICD-10-CM | POA: Insufficient documentation

## 2022-12-20 ENCOUNTER — Ambulatory Visit: Payer: 59 | Admitting: Podiatry

## 2022-12-27 ENCOUNTER — Ambulatory Visit (INDEPENDENT_AMBULATORY_CARE_PROVIDER_SITE_OTHER): Payer: 59 | Admitting: Podiatry

## 2022-12-27 DIAGNOSIS — L608 Other nail disorders: Secondary | ICD-10-CM

## 2022-12-27 NOTE — Progress Notes (Signed)
   Chief Complaint  Patient presents with   Nail Problem    Patient came in today for left foot nail deformity, started 4 months ago patient denies any pain, nail are thick and yellow,     HPI: 67 y.o. male presenting today as a new patient for evaluation of 'tenting' of the nail plates of the third fourth and fifth digit of the left foot.  Patient states that this began somewhat recently.  He says they are not necessarily painful but they are very pointed compared to the other foot.  Presenting for further treatment and evaluation  Past Medical History:  Diagnosis Date   Anxiety    Asthma    Cancer (HCC)    Cirrhosis (HCC)    Depression    Dyspnea    History of kidney stones    Hypertension    Pre-diabetes    Tuberculosis     Past Surgical History:  Procedure Laterality Date   CYSTOSCOPY     EYE SURGERY     as a child   LYMPH NODE DISSECTION Bilateral 02/17/2022   Procedure: PELVIC LYMPH NODE DISSECTION;  Surgeon: Sebastian Ache, MD;  Location: [facility];  Service: Urology;  Laterality: Bilateral;   ROBOT ASSISTED LAPAROSCOPIC RADICAL PROSTATECTOMY N/A 02/17/2022   Procedure: XI ROBOTIC ASSISTED LAPAROSCOPIC RADICAL PROSTATECTOMY, RIGHT INGUINAL HERNIA REPAIR;  Surgeon: Sebastian Ache, MD;  Location: [facility];  Service: Urology;  Laterality: N/A;  3 HRS   ruptured disc      No Known Allergies   Physical Exam: General: The patient is alert and oriented x3 in no acute distress.  Dermatology: Skin is warm, dry and supple bilateral lower extremities.  Pincer nail deformity noted to the third fourth and fifth digits of the left foot  Vascular: Palpable pedal pulses bilaterally. Capillary refill within normal limits.  No appreciable edema.  No erythema.  Neurological: Grossly intact via light touch  Musculoskeletal Exam: No pedal deformities noted  Assessment/Plan of Care: 1.  Pincer nail deformity 3, 4, 5 left  -Patient evaluated.   -Mechanical debridement of the nails was  performed today and the prominent portion of the nail was filed with Dremel without incident or bleeding. -OTC Tolcylen antifungal nail rejuvenation topical was dispensed at checkout.  Apply daily to help soften the nail as it grows out -Recommend good supportive shoes that do not irritate the toes or the toenails     Felecia Shelling, DPM [facility]  Dr. Felecia Shelling, DPM    [address] [place], [place] [address]                Office [phone]  Fax [phone]

## 2023-07-14 IMAGING — DX DG CHEST 1V
1 series · 2 of 2 positions shown · non-contrast
Comparison: Chest x-ray dated February 20, 2006; thoracic spine x-ray
dated Friday July, 2016

CLINICAL DATA: Chest pain

EXAM:
CHEST  1 VIEW

[Series 1: chest · 0.14mm/px · 2 of 2 slices shown]
[im 1/2]
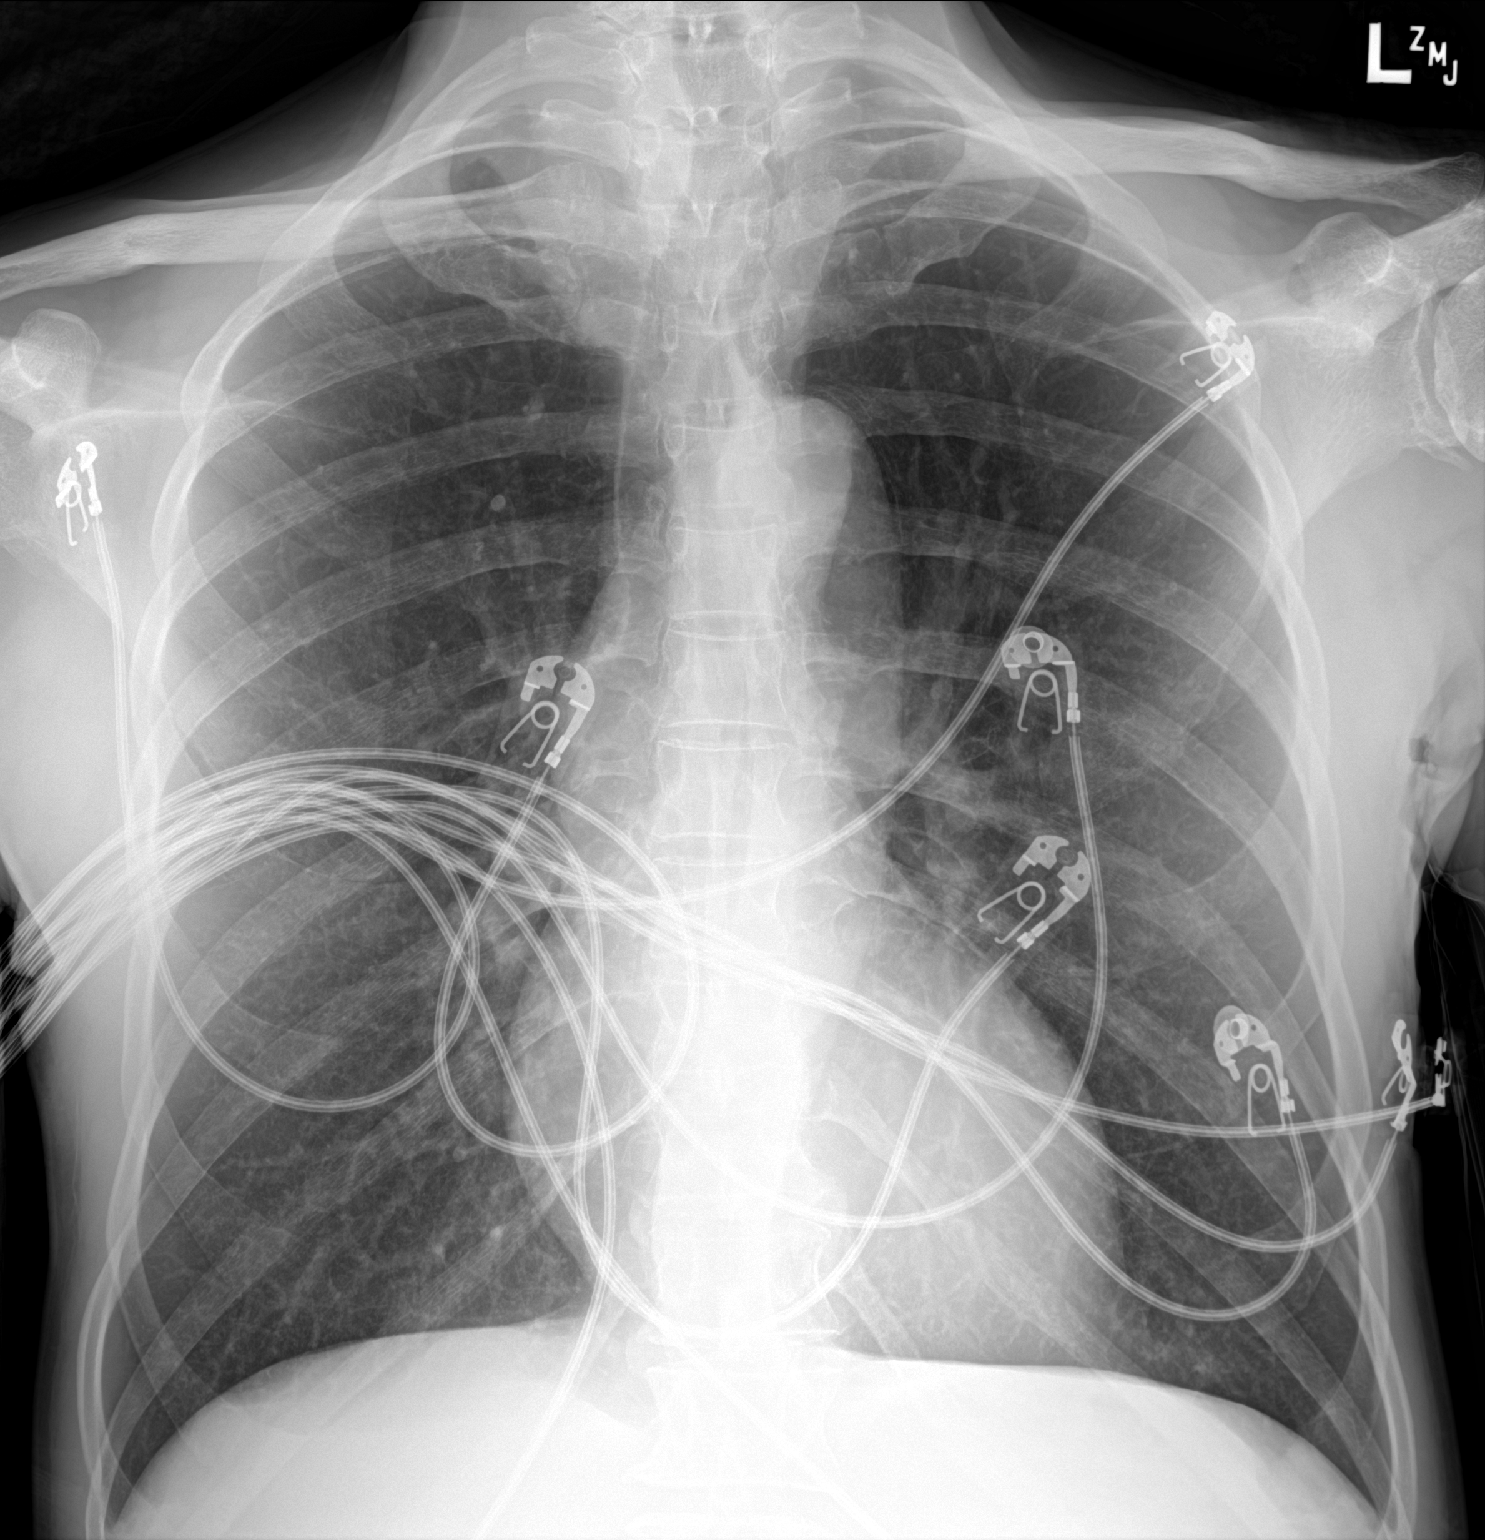
[im 2/2]
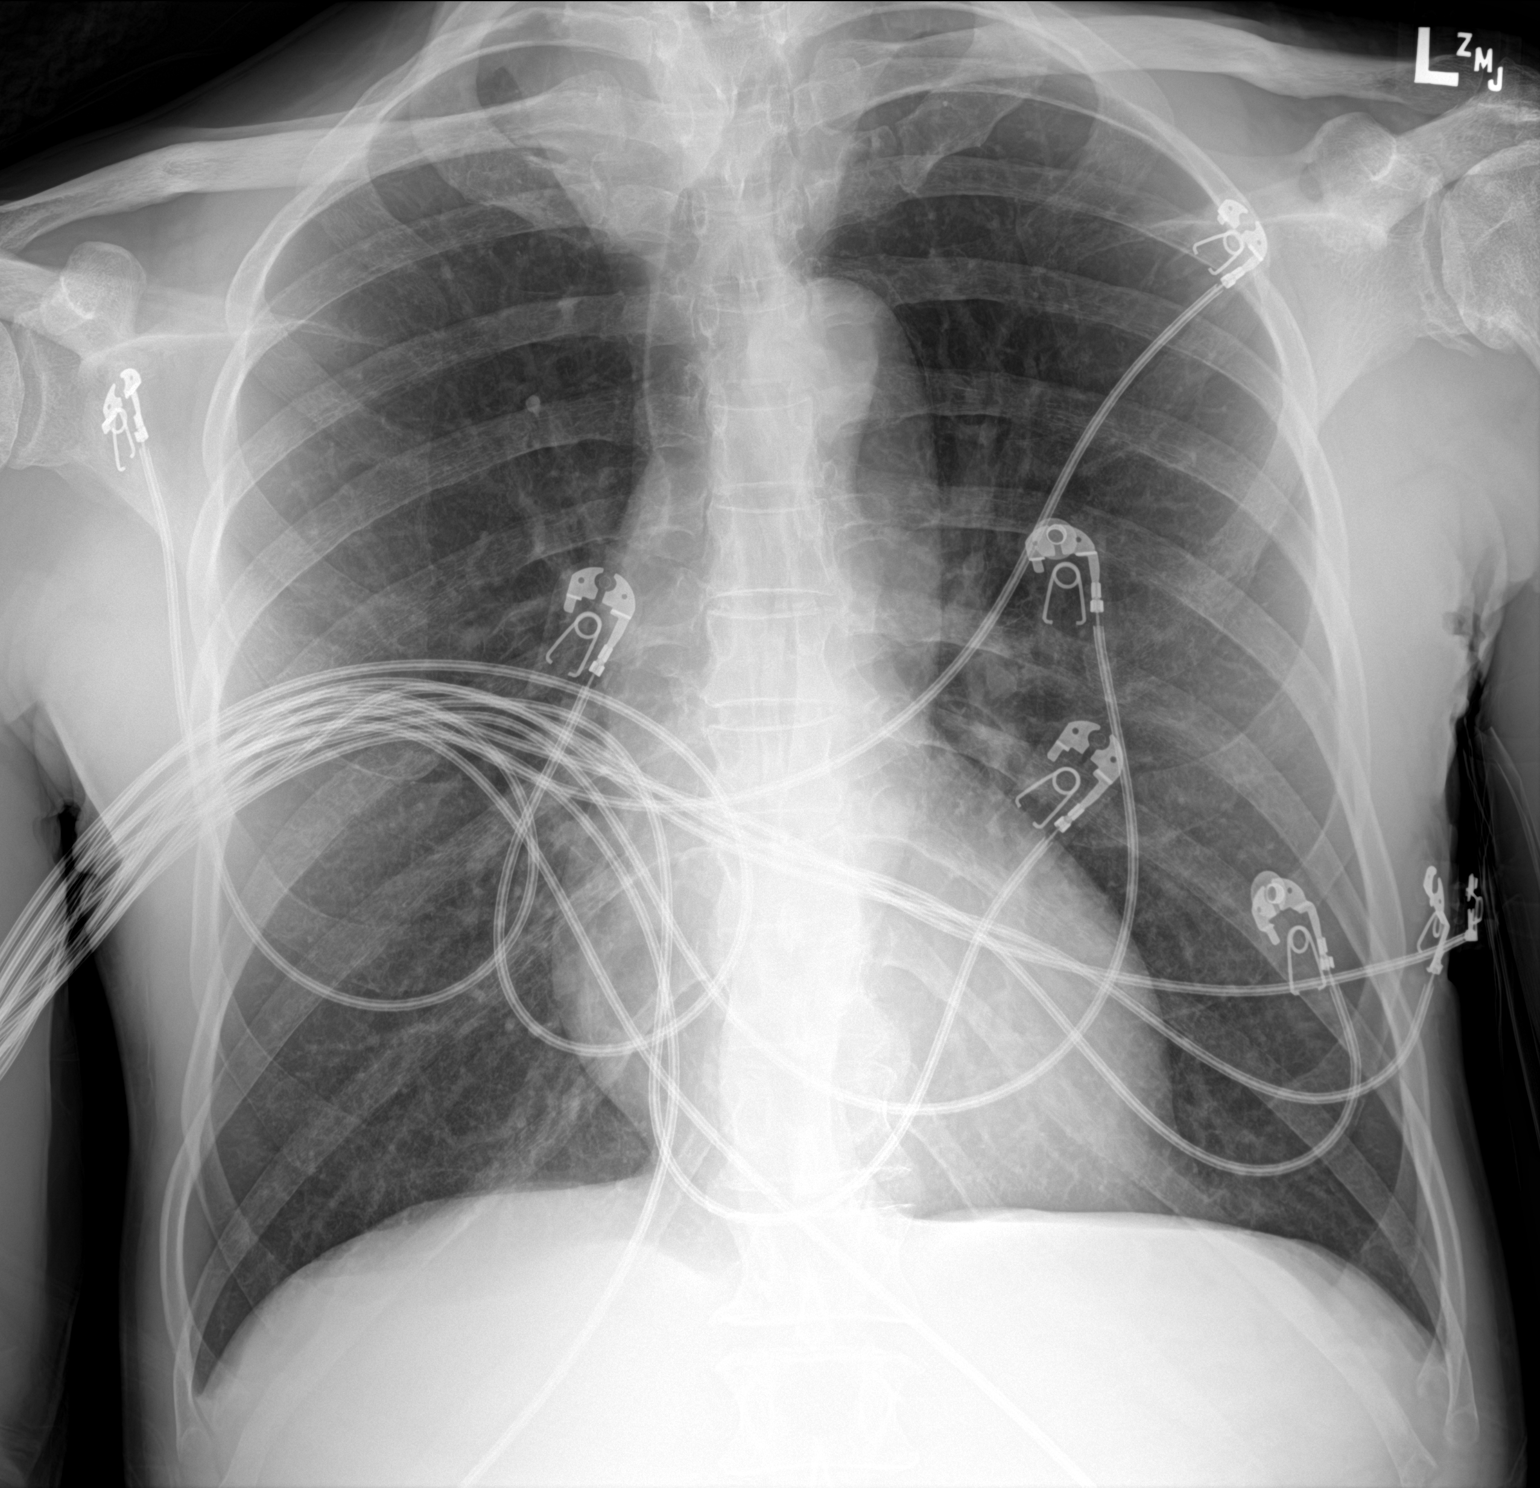

[2 of 2 positions shown; findings below may reference images not displayed]

FINDINGS: Normal heart size. Mild tortuosity of the thoracic aorta, likely age
related. Thickening of the right paratracheal stripe, similar to
prior 0034 thoracic spine x-ray and likely due to vascular
tortuosity or thyroid goiter. Lungs are clear. No pleural effusion
or pneumothorax.
IMPRESSION: No active disease.

## 2023-07-18 ENCOUNTER — Encounter (HOSPITAL_COMMUNITY): Payer: Self-pay

## 2023-07-18 ENCOUNTER — Emergency Department (HOSPITAL_COMMUNITY)
Admission: EM | Admit: 2023-07-18 | Discharge: 2023-07-18 | Disposition: A | Discharge status: Home / Self Care | Payer: No Typology Code available for payment source | Attending: Emergency Medicine | Admitting: Emergency Medicine

## 2023-07-18 ENCOUNTER — Emergency Department (HOSPITAL_COMMUNITY): Payer: No Typology Code available for payment source

## 2023-07-18 ENCOUNTER — Other Ambulatory Visit: Payer: Self-pay

## 2023-07-18 DIAGNOSIS — I10 Essential (primary) hypertension: Secondary | ICD-10-CM | POA: Diagnosis present

## 2023-07-18 DIAGNOSIS — I159 Secondary hypertension, unspecified: Secondary | ICD-10-CM | POA: Insufficient documentation

## 2023-07-18 DIAGNOSIS — Z79899 Other long term (current) drug therapy: Secondary | ICD-10-CM | POA: Diagnosis not present

## 2023-07-18 LAB — CBC WITH DIFFERENTIAL/PLATELET
Abs Immature Granulocytes: 0.03 10*3/uL (ref 0.00–0.07)
Basophils Absolute: 0.1 10*3/uL (ref 0.0–0.1)
Basophils Relative: 1 %
Eosinophils Absolute: 0.1 10*3/uL (ref 0.0–0.5)
Eosinophils Relative: 2 %
HCT: 45.6 % (ref 39.0–52.0)
Hemoglobin: 15.3 g/dL (ref 13.0–17.0)
Immature Granulocytes: 0 %
Lymphocytes Relative: 36 %
Lymphs Abs: 3.2 10*3/uL (ref 0.7–4.0)
MCH: 30.4 pg (ref 26.0–34.0)
MCHC: 33.6 g/dL (ref 30.0–36.0)
MCV: 90.5 fL (ref 80.0–100.0)
Monocytes Absolute: 0.7 10*3/uL (ref 0.1–1.0)
Monocytes Relative: 8 %
Neutro Abs: 4.7 10*3/uL (ref 1.7–7.7)
Neutrophils Relative %: 53 %
Platelets: 222 10*3/uL (ref 150–400)
RBC: 5.04 MIL/uL (ref 4.22–5.81)
RDW: 13.4 % (ref 11.5–15.5)
WBC: 8.8 10*3/uL (ref 4.0–10.5)
nRBC: 0 % (ref 0.0–0.2)

## 2023-07-18 LAB — COMPREHENSIVE METABOLIC PANEL
ALT: 22 U/L (ref 0–44)
AST: 27 U/L (ref 15–41)
Albumin: 4.5 g/dL (ref 3.5–5.0)
Alkaline Phosphatase: 87 U/L (ref 38–126)
Anion gap: 3 — ABNORMAL LOW (ref 5–15)
BUN: 10 mg/dL (ref 8–23)
CO2: 27 mmol/L (ref 22–32)
Calcium: 9 mg/dL (ref 8.9–10.3)
Chloride: 104 mmol/L (ref 98–111)
Creatinine, Ser: 0.77 mg/dL (ref 0.61–1.24)
GFR, Estimated: >60 mL/min (ref >60)
Glucose, Bld: 109 mg/dL — ABNORMAL HIGH (ref 70–99)
Potassium: 3.9 mmol/L (ref 3.5–5.1)
Sodium: 134 mmol/L — ABNORMAL LOW (ref 135–145)
Total Bilirubin: 0.4 mg/dL (ref <1.2)
Total Protein: 8.1 g/dL (ref 6.5–8.1)

## 2023-07-18 LAB — BRAIN NATRIURETIC PEPTIDE: B Natriuretic Peptide: 25.9 pg/mL (ref 0.0–100.0)

## 2023-07-18 LAB — TROPONIN I (HIGH SENSITIVITY)
Troponin I (High Sensitivity): 6 ng/L (ref <18)
Troponin I (High Sensitivity): 6 ng/L (ref <18)

## 2023-07-18 LAB — CBG MONITORING, ED: Glucose-Capillary: 107 mg/dL — ABNORMAL HIGH (ref 70–99)

## 2023-07-18 MED ORDER — AMLODIPINE BESYLATE 5 MG PO TABS
5.0000 mg | ORAL_TABLET | Freq: Once | ORAL | Status: AC
  Administered 2023-07-18: 5 mg via ORAL
  Filled 2023-07-18: qty 1

## 2023-07-18 NOTE — ED Notes (Signed)
PT now answering.

## 2023-07-18 NOTE — ED Notes (Signed)
No answer x1

## 2023-07-18 NOTE — Discharge Instructions (Signed)
You have been seen and discharged from the emergency department.  Your blood pressure was elevated because you were not taking your blood pressure medication.  Your workup here including blood work and heart evaluation were normal.  It is extremely important that you are compliant with your blood pressure medication.  Follow-up with your primary provider for further evaluation and further care. Take home medications as prescribed. If you have any worsening symptoms or further concerns for your health please return to an emergency department for further evaluation.

## 2023-07-18 NOTE — ED Triage Notes (Signed)
Pt saw PCP today and BP was in the 200s, pt states she is noncompliant with BP meds, had dose at PCP today, had missed 3 days of meds prior to visit.

## 2023-07-18 NOTE — ED Provider Triage Note (Signed)
Emergency Medicine Provider Triage Evaluation Note  Brian Lane , a 67 y.o. male  was evaluated in triage.  Pt complains of HTN.  Review of Systems  Positive: SOB x 1 day Negative: Chest pain, vomitin  Physical Exam  BP (!) 172/117   Pulse 68   Temp 98.1 F (36.7 C) (Oral)   Resp 18   Ht 5\' 6"  (1.676 m)   Wt 57.6 kg   SpO2 96%   BMI 20.50 kg/m  Gen:   Awake, no distress   Resp:  Normal effort  MSK:   Moves extremities without difficulty  Other:    Medical Decision Making  Medically screening exam initiated at 4:41 PM.  Appropriate orders placed.  JOSEALFREDO LANGTRY was informed that the remainder of the evaluation will be completed by another provider, this initial triage assessment does not replace that evaluation, and the importance of remaining in the ED until their evaluation is complete.  Out of medications x 3-4 days, but does not take them as prescribed usually.  Also reports nocturia, and dry mouth. H/o "borderline" DM   Elpidio Anis, PA-C 07/18/23 1643

## 2023-07-18 NOTE — ED Provider Notes (Signed)
[name] EMERGENCY DEPARTMENT AT [facility] Provider Note   CSN: [id number] Arrival date & time: 07/18/23  1609     History  Chief Complaint  Patient presents with   Hypertension    Brian Lane is a 67 y.o. male.  HPI   67 year old male presents emergency department with concern for high blood pressure.  He admittedly has not taken his medication in the past 3 days.  He has plenty of medication but just states that he sometimes does not like to take it.  Endorses generalized weakness and had an episode of chest pain in the other day but is otherwise chest pain-free.  No headache or neurosymptoms.  No recent illness.  Home Medications Prior to Admission medications   Medication Sig Start Date End Date Taking? Authorizing Provider  albuterol (VENTOLIN HFA) 108 (90 Base) MCG/ACT inhaler Inhale 2 puffs into the lungs every 4 (four) hours as needed for wheezing or shortness of breath.    [provider]  amLODipine (NORVASC) 5 MG tablet Take 5 mg by mouth daily.    [provider]  ascorbic acid (VITAMIN C) 250 MG tablet TAKE ONE TABLET BY MOUTH MONDAY, WEDNESDAY AND FRIDAY for iron absorption 09/13/22   [provider]  atorvastatin (LIPITOR) 40 MG tablet TAKE ONE TABLET BY MOUTH AT BEDTIME FOR CHOLESTEROL.  NOTIFY CLINIC FOR UNUSUAL MUSCLE ACHES OR WEAKNESS, BROWN URINE, BLOOD IN  URINE, YELLOWING OF SKIN/WHITES OF EYES, PERSISTENT  NAUSEA/VOMITING/DIARRHEA FOR CHOLESTEROL.  NOTIFY CLINIC FOR UNUSUAL MUSCLE ACHES OR WEAKNESS, BROWN URINE, BLOOD IN  URINE, YELLOWING OF SKIN/WHITES OF EYES, PERSISTENT  NAUSEA/VOMITING/DIARRHEA 08/18/21   [provider]  busPIRone (BUSPAR) 7.5 MG tablet Take 7.5 mg by mouth 2 (two) times daily.    [provider]  cholecalciferol (VITAMIN D3) 25 MCG (1000 UNIT) tablet Take 1,000 Units by mouth daily.    [provider]  FLUoxetine (PROZAC) 10 MG capsule TAKE ONE CAPSULE BY MOUTH DAILY  FOR 10 DAYS, THEN TAKE TWO CAPSULES DAILY FOR DEPRESSION AND ANXIETY 06/07/22   [provider]  ibuprofen (ADVIL) 200 MG tablet Take 400 mg by mouth every 8 (eight) hours as needed for moderate pain.    [provider]  LINZESS 145 MCG CAPS capsule Take 145 mcg by mouth every morning. 11/23/21   [provider]  methadone (DOLOPHINE) 10 MG/ML solution Take 60 mg by mouth daily.    [provider]  naloxone (NARCAN) nasal spray 4 mg/0.1 mL SPRAY 1 SPRAY INTO ONE NOSTRIL AS DIRECTED AS NEEDED FOR RESCUE FOR OPIOID OVERDOSE - CALL 911 IMMEDIATELY, ADMINISTER DOSE, THEN TURN PERSON ON SIDE - IF NO RESPONSE IN 2-3 MINUTES OR PERSON RESPONDS BUT RELAPSES, REPEAT USING A NEW SPRAY DEVICE AND SPRAY INTO THE OTHER NOSTRIL 03/04/22   [provider]  oxybutynin (DITROPAN) 5 MG tablet Take 1 tablet by mouth every morning. 09/10/22   [provider]  sildenafil (VIAGRA) 100 MG tablet TAKE ONE TABLET BY MOUTH AS DIRECTED (TAKE 1 HOUR PRIOR TO SEXUAL ACTIVITY *DO NOT EXCEED 1 DOSE PER 24 HOUR PERIOD*) 09/10/22   [provider]      Allergies    Patient has no known allergies.    Review of Systems   Review of Systems  Constitutional:  Positive for fatigue. Negative for fever.  Respiratory:  Positive for chest tightness. Negative for shortness of breath.   Cardiovascular:  Negative for chest pain.  Gastrointestinal:  Negative for abdominal  pain, diarrhea and vomiting.  Skin:  Negative for rash.  Neurological:  Negative for headaches.    Physical Exam Updated Vital Signs BP (!) 171/108 (BP Location: Left Arm)   Pulse 73   Temp 98.4 F (36.9 C) (Oral)   Resp 16   Ht 5\' 6"  (1.676 m)   Wt 57.6 kg   SpO2 98%   BMI 20.50 kg/m  Physical Exam Vitals and nursing note reviewed.  Constitutional:      General: He is not in acute distress.    Appearance: Normal appearance.  HENT:     Head: Normocephalic.     Mouth/Throat:     Mouth: Mucous  membranes are moist.  Cardiovascular:     Rate and Rhythm: Normal rate.  Pulmonary:     Effort: Pulmonary effort is normal. No respiratory distress.     Breath sounds: No rales.  Abdominal:     Palpations: Abdomen is soft.     Tenderness: There is no abdominal tenderness.  Musculoskeletal:        General: No swelling.  Skin:    General: Skin is warm.  Neurological:     Mental Status: He is alert and oriented to person, place, and time. Mental status is at baseline.  Psychiatric:        Mood and Affect: Mood normal.     ED Results / Procedures / Treatments   Labs (all labs ordered are listed, but only abnormal results are displayed) Labs Reviewed  COMPREHENSIVE METABOLIC PANEL - Abnormal; Notable for the following components:      Result Value   Sodium 134 (*)    Glucose, Bld 109 (*)    Anion gap 3 (*)    All other components within normal limits  CBG MONITORING, ED - Abnormal; Notable for the following components:   Glucose-Capillary 107 (*)    All other components within normal limits  CBC WITH DIFFERENTIAL/PLATELET  BRAIN NATRIURETIC PEPTIDE  TROPONIN I (HIGH SENSITIVITY)  TROPONIN I (HIGH SENSITIVITY)    EKG None  Radiology DG Chest 2 View  Result Date: 07/18/2023 CLINICAL DATA:  Shortness of breath. EXAM: CHEST - 2 VIEW COMPARISON:  Chest radiograph dated 07/26/2021. FINDINGS: No focal consolidation, pleural effusion, or pneumothorax. The cardiac silhouette is within normal limits. No acute osseous pathology. IMPRESSION: No active cardiopulmonary disease. Electronically Signed   By: Elgie Collard M.D.   On: 07/18/2023 20:58    Procedures Procedures    Medications Ordered in ED Medications  amLODipine (NORVASC) tablet 5 mg (5 mg Oral Given 07/18/23 1704)    ED Course/ Medical Decision Making/ A&P                                 Medical Decision Making  67 year old male presents emergency department with hypertension.  He has been noncompliant with  his home medication for the past couple days.  Was hypertensive on arrival, given a dose of his home medication in triage.  On my evaluation blood pressure has downtrended to 170s systolic.  Patient denies any active chest pain, shortness of breath, headache or neurosymptoms.  EKG is sinus, and has no acute ischemic changes.  Blood work is reassuring, troponins are negative.  On reevaluation patient feels well.  Encouraged patient to be compliant with his medication.  He states he does not need any refills and that he will go home and take his medication.  Patient at  this time appears safe and stable for discharge and close outpatient follow up. Discharge plan and strict return to ED precautions discussed, patient verbalizes understanding and agreement.        Final Clinical Impression(s) / ED Diagnoses Final diagnoses:  Secondary hypertension    Rx / DC Orders ED Discharge Orders     None         Rozelle Logan, DO 07/18/23 2348

## 2023-10-31 ENCOUNTER — Other Ambulatory Visit: Payer: Self-pay

## 2023-10-31 ENCOUNTER — Emergency Department (HOSPITAL_BASED_OUTPATIENT_CLINIC_OR_DEPARTMENT_OTHER)
Admission: EM | Admit: 2023-10-31 | Discharge: 2023-10-31 | Discharge status: Against Medical Advice | Attending: Emergency Medicine | Admitting: Emergency Medicine

## 2023-10-31 DIAGNOSIS — R5383 Other fatigue: Secondary | ICD-10-CM | POA: Diagnosis not present

## 2023-10-31 DIAGNOSIS — R110 Nausea: Secondary | ICD-10-CM | POA: Diagnosis present

## 2023-10-31 DIAGNOSIS — Z5321 Procedure and treatment not carried out due to patient leaving prior to being seen by health care provider: Secondary | ICD-10-CM | POA: Insufficient documentation

## 2023-10-31 DIAGNOSIS — R109 Unspecified abdominal pain: Secondary | ICD-10-CM | POA: Diagnosis not present

## 2023-10-31 LAB — CBC
HCT: 42.4 % (ref 39.0–52.0)
Hemoglobin: 14.3 g/dL (ref 13.0–17.0)
MCH: 29.9 pg (ref 26.0–34.0)
MCHC: 33.7 g/dL (ref 30.0–36.0)
MCV: 88.5 fL (ref 80.0–100.0)
Platelets: 260 10*3/uL (ref 150–400)
RBC: 4.79 MIL/uL (ref 4.22–5.81)
RDW: 13.4 % (ref 11.5–15.5)
WBC: 4.9 10*3/uL (ref 4.0–10.5)
nRBC: 0 % (ref 0.0–0.2)

## 2023-10-31 LAB — COMPREHENSIVE METABOLIC PANEL
ALT: 27 U/L (ref 0–44)
AST: 26 U/L (ref 15–41)
Albumin: 5 g/dL (ref 3.5–5.0)
Alkaline Phosphatase: 81 U/L (ref 38–126)
Anion gap: 9 (ref 5–15)
BUN: 9 mg/dL (ref 8–23)
CO2: 27 mmol/L (ref 22–32)
Calcium: 9.5 mg/dL (ref 8.9–10.3)
Chloride: 99 mmol/L (ref 98–111)
Creatinine, Ser: 0.97 mg/dL (ref 0.61–1.24)
GFR, Estimated: >60 mL/min (ref >60)
Glucose, Bld: 105 mg/dL — ABNORMAL HIGH (ref 70–99)
Potassium: 3.6 mmol/L (ref 3.5–5.1)
Sodium: 135 mmol/L (ref 135–145)
Total Bilirubin: 0.4 mg/dL (ref 0.0–1.2)
Total Protein: 8.3 g/dL — ABNORMAL HIGH (ref 6.5–8.1)

## 2023-10-31 LAB — LIPASE, BLOOD: Lipase: 10 U/L — ABNORMAL LOW (ref 11–51)

## 2023-10-31 MED ORDER — ONDANSETRON 4 MG PO TBDP
4.0000 mg | ORAL_TABLET | Freq: Once | ORAL | Status: AC | PRN
  Administered 2023-10-31: 4 mg via ORAL
  Filled 2023-10-31: qty 1

## 2023-10-31 NOTE — ED Triage Notes (Signed)
 Pt POV reporting nausea, fatigue, and abd cramping since yesterday. Thought it was constipation, took laxative today, sx improved after BM and then returned.

## 2023-10-31 NOTE — ED Notes (Signed)
Pt called x 3 , no answer

## 2023-11-01 ENCOUNTER — Emergency Department (HOSPITAL_BASED_OUTPATIENT_CLINIC_OR_DEPARTMENT_OTHER)
Admission: EM | Admit: 2023-11-01 | Discharge: 2023-11-01 | Disposition: A | Discharge status: Home / Self Care | Attending: Emergency Medicine | Admitting: Emergency Medicine

## 2023-11-01 ENCOUNTER — Emergency Department (HOSPITAL_BASED_OUTPATIENT_CLINIC_OR_DEPARTMENT_OTHER)

## 2023-11-01 DIAGNOSIS — K529 Noninfective gastroenteritis and colitis, unspecified: Secondary | ICD-10-CM | POA: Diagnosis not present

## 2023-11-01 DIAGNOSIS — R109 Unspecified abdominal pain: Secondary | ICD-10-CM | POA: Diagnosis present

## 2023-11-01 LAB — URINALYSIS, ROUTINE W REFLEX MICROSCOPIC
Bilirubin Urine: NEGATIVE
Glucose, UA: NEGATIVE mg/dL
Hgb urine dipstick: NEGATIVE
Ketones, ur: NEGATIVE mg/dL
Leukocytes,Ua: NEGATIVE
Nitrite: NEGATIVE
Protein, ur: NEGATIVE mg/dL
Specific Gravity, Urine: 1.014 (ref 1.005–1.030)
pH: 5.5 (ref 5.0–8.0)

## 2023-11-01 MED ORDER — IOHEXOL 300 MG/ML  SOLN
100.0000 mL | Freq: Once | INTRAMUSCULAR | Status: AC | PRN
  Administered 2023-11-01: 85 mL via INTRAVENOUS

## 2023-11-01 MED ORDER — LACTATED RINGERS IV BOLUS
1000.0000 mL | Freq: Once | INTRAVENOUS | Status: AC
  Administered 2023-11-01: 1000 mL via INTRAVENOUS

## 2023-11-01 MED ORDER — DICYCLOMINE HCL 20 MG PO TABS
20.0000 mg | ORAL_TABLET | Freq: Two times a day (BID) | ORAL | 0 refills | Status: AC | PRN
Start: 2023-11-01 — End: ?

## 2023-11-01 MED ORDER — DICYCLOMINE HCL 10 MG PO CAPS
10.0000 mg | ORAL_CAPSULE | Freq: Once | ORAL | Status: AC
  Administered 2023-11-01: 10 mg via ORAL
  Filled 2023-11-01: qty 1

## 2023-11-01 NOTE — ED Notes (Signed)
Patient back.

## 2023-11-01 NOTE — ED Triage Notes (Signed)
 Pt seen in this ED earlier today, had to leave due to emergency, nausea, fatigue, and abd cramping since yesterday, states that sx have resolved but would still like to be seen.

## 2023-11-01 NOTE — ED Provider Notes (Signed)
 [place] EMERGENCY DEPARTMENT AT [facility] Provider Note   CSN: [id number] Arrival date & time: 11/01/23  0038     History  Chief Complaint  Patient presents with   Abdominal Pain    SYON TEWS is a 68 y.o. male.  Was here earlier but had to leave for family emergency. A couple episodes of bilateral abdominal pain with bloating and feeling of cramping. Once better with BM bu worse again when twisting. Improved prior to returning and currently symptom free. No emesis or diarrhea. No fever.    Abdominal Pain      Home Medications Prior to Admission medications   Medication Sig Start Date End Date Taking? Authorizing Provider  dicyclomine (BENTYL) 20 MG tablet Take 1 tablet (20 mg total) by mouth 2 (two) times daily as needed for spasms (abdominal cramping). 11/01/23  Yes Lennex Pietila, Barbara Cower, MD  albuterol (VENTOLIN HFA) 108 (90 Base) MCG/ACT inhaler Inhale 2 puffs into the lungs every 4 (four) hours as needed for wheezing or shortness of breath.    [provider]  amLODipine (NORVASC) 5 MG tablet Take 5 mg by mouth daily.    [provider]  ascorbic acid (VITAMIN C) 250 MG tablet TAKE ONE TABLET BY MOUTH MONDAY, WEDNESDAY AND FRIDAY for iron absorption 09/13/22   [provider]  atorvastatin (LIPITOR) 40 MG tablet TAKE ONE TABLET BY MOUTH AT BEDTIME FOR CHOLESTEROL.  NOTIFY CLINIC FOR UNUSUAL MUSCLE ACHES OR WEAKNESS, BROWN URINE, BLOOD IN  URINE, YELLOWING OF SKIN/WHITES OF EYES, PERSISTENT  NAUSEA/VOMITING/DIARRHEA FOR CHOLESTEROL.  NOTIFY CLINIC FOR UNUSUAL MUSCLE ACHES OR WEAKNESS, BROWN URINE, BLOOD IN  URINE, YELLOWING OF SKIN/WHITES OF EYES, PERSISTENT  NAUSEA/VOMITING/DIARRHEA 08/18/21   [provider]  busPIRone (BUSPAR) 7.5 MG tablet Take 7.5 mg by mouth 2 (two) times daily.    [provider]  cholecalciferol (VITAMIN D3) 25 MCG (1000 UNIT) tablet Take 1,000 Units by mouth daily.    [provider]   FLUoxetine (PROZAC) 10 MG capsule TAKE ONE CAPSULE BY MOUTH DAILY FOR 10 DAYS, THEN TAKE TWO CAPSULES DAILY FOR DEPRESSION AND ANXIETY 06/07/22   [provider]  ibuprofen (ADVIL) 200 MG tablet Take 400 mg by mouth every 8 (eight) hours as needed for moderate pain.    [provider]  LINZESS 145 MCG CAPS capsule Take 145 mcg by mouth every morning. 11/23/21   [provider]  methadone (DOLOPHINE) 10 MG/ML solution Take 60 mg by mouth daily.    [provider]  naloxone (NARCAN) nasal spray 4 mg/0.1 mL SPRAY 1 SPRAY INTO ONE NOSTRIL AS DIRECTED AS NEEDED FOR RESCUE FOR OPIOID OVERDOSE - CALL 911 IMMEDIATELY, ADMINISTER DOSE, THEN TURN PERSON ON SIDE - IF NO RESPONSE IN 2-3 MINUTES OR PERSON RESPONDS BUT RELAPSES, REPEAT USING A NEW SPRAY DEVICE AND SPRAY INTO THE OTHER NOSTRIL 03/04/22   [provider]  oxybutynin (DITROPAN) 5 MG tablet Take 1 tablet by mouth every morning. 09/10/22   [provider]  sildenafil (VIAGRA) 100 MG tablet TAKE ONE TABLET BY MOUTH AS DIRECTED (TAKE 1 HOUR PRIOR TO SEXUAL ACTIVITY *DO NOT EXCEED 1 DOSE PER 24 HOUR PERIOD*) 09/10/22   [provider]      Allergies    Patient has no known allergies.    Review of Systems   Review of Systems  Gastrointestinal:  Positive for abdominal pain.    Physical Exam Updated Vital Signs BP (!) 142/89   Pulse 60  Temp 98.4 F (36.9 C)   Resp 16   SpO2 96%  Physical Exam  ED Results / Procedures / Treatments   Labs (all labs ordered are listed, but only abnormal results are displayed) Labs Reviewed - No data to display  EKG None  Radiology CT ABDOMEN PELVIS W CONTRAST Result Date: 11/01/2023 CLINICAL DATA:  Nausea, fatigue and abdominal pain. EXAM: CT ABDOMEN AND PELVIS WITH CONTRAST TECHNIQUE: Multidetector CT imaging of the abdomen and pelvis was performed using the standard protocol following bolus administration of intravenous contrast. RADIATION  DOSE REDUCTION: This exam was performed according to the departmental dose-optimization program which includes automated exposure control, adjustment of the mA and/or kV according to patient size and/or use of iterative reconstruction technique. CONTRAST:  85mL OMNIPAQUE IOHEXOL 300 MG/ML  SOLN COMPARISON:  Similar study of 02/25/2006. FINDINGS: Lower chest: There is posterior linear scarring or atelectasis in the lower lobes. Lung bases are otherwise clear. Hepatobiliary: No focal liver abnormality is seen. The liver is mildly steatotic. No calcified gallstones, gallbladder wall thickening, or biliary dilatation. Pancreas: No abnormality. Spleen: No abnormality. Adrenals/Urinary Tract: There is no adrenal mass. Contrast opacifies the collecting systems and ureters and would obscure intrarenal stones if present. There is no hydronephrosis or ureteral filling defect. There is a 1.5 cm parapelvic Bosniak 1 cyst in the inferior pole left kidney, Hounsfield density is 19.5. There are occasional Bosniak 2 subcentimeter cysts in both kidneys which are too small to characterize. No follow-up imaging is recommended. There is no renal mass enhancement. No bladder thickening is seen. Stomach/Bowel: Small hiatal hernia. Stomach is contracted and not well seen. There is no bowel dilatation or wall thickening. The appendix is normal and well visible. There is moderate retained stool ascending colon, scattered uncomplicated sigmoid diverticula. There is fluid in the transverse and descending colon rectum consistent with a diarrheal enteritis without wall thickening. Vascular/Lymphatic: Aortic atherosclerosis. No enlarged abdominal or pelvic lymph nodes. Reproductive: Prostate is unremarkable. Other: Interval supraumbilical midline anterior wall hernia repair, intact. There is a small umbilical fat hernia. There is no incarcerated hernia. There is no free fluid, free hemorrhage or free air, or focal inflammatory process.  Musculoskeletal: Degenerative changes lumbar spine with slight dextroscoliosis. Reactive Peridiscal endplate sclerosis with no destructive endplate changes is noted at L4-5. IMPRESSION: 1. Fluid in the transverse and descending colon consistent with a diarrheal enteritis. No wall thickening or inflammatory change. 2. Constipation ascending colon. No bowel obstruction. Sigmoid diverticula. 3. Mild hepatic steatosis. 4. Aortic atherosclerosis. 5. Umbilical fat hernia.  Small hiatal hernia Electronically Signed   By: Almira Bar M.D.   On: 11/01/2023 05:20    Procedures Procedures    Medications Ordered in ED Medications  dicyclomine (BENTYL) capsule 10 mg (10 mg Oral Given 11/01/23 0341)  lactated ringers bolus 1,000 mL (0 mLs Intravenous Stopped 11/01/23 0505)  iohexol (OMNIPAQUE) 300 MG/ML solution 100 mL (85 mLs Intravenous Contrast Given 11/01/23 0410)    ED Course/ Medical Decision Making/ A&P                                 Medical Decision Making Amount and/or Complexity of Data Reviewed Radiology: ordered.  Risk Prescription drug management.   Ct reassuring. Enteritis likely related to the cramping/bloating feeling. No indication for antibiotics.  Still feeling much better. Tolerated PO without difficulty. Stable for d/c w/ pcp follow up.    Final Clinical Impression(s) /  ED Diagnoses Final diagnoses:  Abdominal cramping  Enteritis    Rx / DC Orders ED Discharge Orders          Ordered    dicyclomine (BENTYL) 20 MG tablet  2 times daily PRN        11/01/23 0606              Nyah Shepherd, Barbara Cower, MD 11/01/23 [phone]

## 2023-11-01 NOTE — ED Notes (Signed)
 Patient to CT via stretcher.

## 2023-12-28 ENCOUNTER — Emergency Department (HOSPITAL_BASED_OUTPATIENT_CLINIC_OR_DEPARTMENT_OTHER)
Admission: EM | Admit: 2023-12-28 | Discharge: 2023-12-28 | Disposition: A | Discharge status: Home / Self Care | Attending: Emergency Medicine | Admitting: Emergency Medicine

## 2023-12-28 ENCOUNTER — Other Ambulatory Visit: Payer: Self-pay

## 2023-12-28 ENCOUNTER — Emergency Department (HOSPITAL_BASED_OUTPATIENT_CLINIC_OR_DEPARTMENT_OTHER): Admitting: Radiology

## 2023-12-28 DIAGNOSIS — Z7951 Long term (current) use of inhaled steroids: Secondary | ICD-10-CM | POA: Diagnosis not present

## 2023-12-28 DIAGNOSIS — R0981 Nasal congestion: Secondary | ICD-10-CM | POA: Diagnosis present

## 2023-12-28 DIAGNOSIS — R109 Unspecified abdominal pain: Secondary | ICD-10-CM | POA: Diagnosis not present

## 2023-12-28 DIAGNOSIS — Z79899 Other long term (current) drug therapy: Secondary | ICD-10-CM | POA: Diagnosis not present

## 2023-12-28 DIAGNOSIS — M7918 Myalgia, other site: Secondary | ICD-10-CM | POA: Insufficient documentation

## 2023-12-28 DIAGNOSIS — R110 Nausea: Secondary | ICD-10-CM | POA: Insufficient documentation

## 2023-12-28 DIAGNOSIS — R0602 Shortness of breath: Secondary | ICD-10-CM | POA: Insufficient documentation

## 2023-12-28 DIAGNOSIS — Z8546 Personal history of malignant neoplasm of prostate: Secondary | ICD-10-CM | POA: Insufficient documentation

## 2023-12-28 DIAGNOSIS — I10 Essential (primary) hypertension: Secondary | ICD-10-CM | POA: Diagnosis not present

## 2023-12-28 DIAGNOSIS — J45909 Unspecified asthma, uncomplicated: Secondary | ICD-10-CM | POA: Diagnosis not present

## 2023-12-28 LAB — COMPREHENSIVE METABOLIC PANEL WITH GFR
ALT: 17 U/L (ref 0–44)
AST: 28 U/L (ref 15–41)
Albumin: 4.4 g/dL (ref 3.5–5.0)
Alkaline Phosphatase: 101 U/L (ref 38–126)
Anion gap: 16 — ABNORMAL HIGH (ref 5–15)
BUN: 7 mg/dL — ABNORMAL LOW (ref 8–23)
CO2: 21 mmol/L — ABNORMAL LOW (ref 22–32)
Calcium: 9.6 mg/dL (ref 8.9–10.3)
Chloride: 100 mmol/L (ref 98–111)
Creatinine, Ser: 1.09 mg/dL (ref 0.61–1.24)
GFR, Estimated: >60 mL/min (ref >60)
Glucose, Bld: 185 mg/dL — ABNORMAL HIGH (ref 70–99)
Potassium: 3.5 mmol/L (ref 3.5–5.1)
Sodium: 137 mmol/L (ref 135–145)
Total Bilirubin: 0.3 mg/dL (ref 0.0–1.2)
Total Protein: 7.2 g/dL (ref 6.5–8.1)

## 2023-12-28 LAB — RESP PANEL BY RT-PCR (RSV, FLU A&B, COVID)  RVPGX2
Influenza A by PCR: NEGATIVE
Influenza B by PCR: NEGATIVE
Resp Syncytial Virus by PCR: NEGATIVE
SARS Coronavirus 2 by RT PCR: NEGATIVE

## 2023-12-28 LAB — CBC
HCT: 42.8 % (ref 39.0–52.0)
Hemoglobin: 14.2 g/dL (ref 13.0–17.0)
MCH: 29.7 pg (ref 26.0–34.0)
MCHC: 33.2 g/dL (ref 30.0–36.0)
MCV: 89.5 fL (ref 80.0–100.0)
Platelets: 236 10*3/uL (ref 150–400)
RBC: 4.78 MIL/uL (ref 4.22–5.81)
RDW: 12.9 % (ref 11.5–15.5)
WBC: 6.4 10*3/uL (ref 4.0–10.5)
nRBC: 0 % (ref 0.0–0.2)

## 2023-12-28 LAB — D-DIMER, QUANTITATIVE: D-Dimer, Quant: 0.32 ug{FEU}/mL (ref 0.00–0.50)

## 2023-12-28 LAB — LIPASE, BLOOD: Lipase: 14 U/L (ref 11–51)

## 2023-12-28 LAB — TROPONIN T, HIGH SENSITIVITY: Troponin T High Sensitivity: 16 ng/L (ref <19)

## 2023-12-28 MED ORDER — TRIAMCINOLONE ACETONIDE 55 MCG/ACT NA AERO: 2.0000 | INHALATION_SPRAY | Freq: Every day | NASAL | 0 refills | Status: AC

## 2023-12-28 MED ORDER — ALBUTEROL SULFATE HFA 108 (90 BASE) MCG/ACT IN AERS
2.0000 | INHALATION_SPRAY | RESPIRATORY_TRACT | Status: DC | PRN
  Administered 2023-12-28: 2 via RESPIRATORY_TRACT
  Filled 2023-12-28: qty 6.7

## 2023-12-28 MED ORDER — ALBUTEROL SULFATE HFA 108 (90 BASE) MCG/ACT IN AERS: 1.0000 | INHALATION_SPRAY | Freq: Four times a day (QID) | RESPIRATORY_TRACT | 0 refills | Status: AC | PRN

## 2023-12-28 MED ORDER — IPRATROPIUM-ALBUTEROL 0.5-2.5 (3) MG/3ML IN SOLN
3.0000 mL | Freq: Once | RESPIRATORY_TRACT | Status: AC
  Administered 2023-12-28: 3 mL via RESPIRATORY_TRACT
  Filled 2023-12-28: qty 3

## 2023-12-28 MED ORDER — POLYETHYLENE GLYCOL 3350 17 G PO PACK: 17.0000 g | PACK | Freq: Every day | ORAL | 0 refills | Status: AC

## 2023-12-28 MED ORDER — ALBUTEROL SULFATE (2.5 MG/3ML) 0.083% IN NEBU
2.5000 mg | INHALATION_SOLUTION | Freq: Once | RESPIRATORY_TRACT | Status: AC
  Administered 2023-12-28: 2.5 mg via RESPIRATORY_TRACT
  Filled 2023-12-28: qty 3

## 2023-12-28 MED ORDER — PREDNISONE 20 MG PO TABS: 40.0000 mg | ORAL_TABLET | Freq: Every day | ORAL | 0 refills | Status: AC

## 2023-12-28 NOTE — ED Provider Notes (Signed)
 [place] EMERGENCY DEPARTMENT AT [facility] Provider Note   CSN: [id number] Arrival date & time: 12/28/23  1508     History  No chief complaint on file.   Brian Lane is a 68 y.o. male.  HPI   68 year old male presents emergency department with complaints of nasal congestion, shortness of breath, body aches.  States that he has been having symptoms progressively worsening over the past several days.  Denies any known sick exposure.  States that he was having some cramping abdominal pain earlier today with some nausea but states that his symptoms have since improved after having a bowel movement. States that he feels like he has been constipated.  Denies any known fevers, chest pain, vomiting, urinary symptoms, change in bowel habits.  Past medical history significant for asthma, anxiety, cancer, cirrhosis, hypertension, nephrolithiasis, polysubstance use, hyperlipidemia, hepatitis C, pulmonary emphysema, prostate cancer, opioid dependence, homelessness, tobacco dependence, MDD  Home Medications Prior to Admission medications   Medication Sig Start Date End Date Taking? Authorizing Provider  albuterol  (VENTOLIN  HFA) 108 (90 Base) MCG/ACT inhaler Inhale 2 puffs into the lungs every 4 (four) hours as needed for wheezing or shortness of breath.    [provider]  amLODipine  (NORVASC ) 5 MG tablet Take 5 mg by mouth daily.    [provider]  ascorbic acid (VITAMIN C) 250 MG tablet TAKE ONE TABLET BY MOUTH MONDAY, WEDNESDAY AND FRIDAY for iron absorption 09/13/22   [provider]  atorvastatin  (LIPITOR) 40 MG tablet TAKE ONE TABLET BY MOUTH AT BEDTIME FOR CHOLESTEROL.  NOTIFY CLINIC FOR UNUSUAL MUSCLE ACHES OR WEAKNESS, BROWN URINE, BLOOD IN  URINE, YELLOWING OF SKIN/WHITES OF EYES, PERSISTENT  NAUSEA/VOMITING/DIARRHEA FOR CHOLESTEROL.  NOTIFY CLINIC FOR UNUSUAL MUSCLE ACHES OR WEAKNESS, BROWN URINE, BLOOD IN  URINE, YELLOWING OF SKIN/WHITES OF  EYES, PERSISTENT  NAUSEA/VOMITING/DIARRHEA 08/18/21   [provider]  busPIRone  (BUSPAR ) 7.5 MG tablet Take 7.5 mg by mouth 2 (two) times daily.    [provider]  cholecalciferol (VITAMIN D3) 25 MCG (1000 UNIT) tablet Take 1,000 Units by mouth daily.    [provider]  dicyclomine  (BENTYL ) 20 MG tablet Take 1 tablet (20 mg total) by mouth 2 (two) times daily as needed for spasms (abdominal cramping). 11/01/23   Mesner, Jason, MD  FLUoxetine (PROZAC) 10 MG capsule TAKE ONE CAPSULE BY MOUTH DAILY FOR 10 DAYS, THEN TAKE TWO CAPSULES DAILY FOR DEPRESSION AND ANXIETY 06/07/22   [provider]  ibuprofen  (ADVIL ) 200 MG tablet Take 400 mg by mouth every 8 (eight) hours as needed for moderate pain.    [provider]  LINZESS 145 MCG CAPS capsule Take 145 mcg by mouth every morning. 11/23/21   [provider]  methadone  (DOLOPHINE ) 10 MG/ML solution Take 60 mg by mouth daily.    [provider]  naloxone (NARCAN) nasal spray 4 mg/0.1 mL SPRAY 1 SPRAY INTO ONE NOSTRIL AS DIRECTED AS NEEDED FOR RESCUE FOR OPIOID OVERDOSE - CALL 911 IMMEDIATELY, ADMINISTER DOSE, THEN TURN PERSON ON SIDE - IF NO RESPONSE IN 2-3 MINUTES OR PERSON RESPONDS BUT RELAPSES, REPEAT USING A NEW SPRAY DEVICE AND SPRAY INTO THE OTHER NOSTRIL 03/04/22   [provider]  oxybutynin (DITROPAN) 5 MG tablet Take 1 tablet by mouth every morning. 09/10/22   [provider]  sildenafil (VIAGRA) 100 MG tablet TAKE ONE TABLET BY MOUTH AS DIRECTED (TAKE 1 HOUR PRIOR TO SEXUAL ACTIVITY *DO NOT EXCEED 1 DOSE PER 24 HOUR  PERIOD*) 09/10/22   [provider]      Allergies    Patient has no known allergies.    Review of Systems   Review of Systems  All other systems reviewed and are negative.   Physical Exam Updated Vital Signs BP (!) 145/113   Pulse 87   Temp 98 F (36.7 C)   Resp 14   SpO2 99%  Physical Exam Vitals and nursing note reviewed.   Constitutional:      General: He is not in acute distress.    Appearance: He is well-developed.  HENT:     Head: Normocephalic and atraumatic.  Eyes:     Conjunctiva/sclera: Conjunctivae normal.  Cardiovascular:     Rate and Rhythm: Normal rate and regular rhythm.     Heart sounds: No murmur heard. Pulmonary:     Effort: Pulmonary effort is normal. No respiratory distress.     Comments: Decreased lung sounds at the bases. Abdominal:     Palpations: Abdomen is soft.     Tenderness: There is no abdominal tenderness.  Musculoskeletal:        General: No swelling.     Cervical back: Neck supple.  Skin:    General: Skin is warm and dry.     Capillary Refill: Capillary refill takes less than 2 seconds.  Neurological:     Mental Status: He is alert.  Psychiatric:        Mood and Affect: Mood normal.     ED Results / Procedures / Treatments   Labs (all labs ordered are listed, but only abnormal results are displayed) Labs Reviewed  RESP PANEL BY RT-PCR (RSV, FLU A&B, COVID)  [id number]  BASIC METABOLIC PANEL WITH GFR  CBC  TROPONIN T, HIGH SENSITIVITY    EKG None  Radiology No results found.  Procedures Procedures    Medications Ordered in ED Medications - No data to display  ED Course/ Medical Decision Making/ A&P Clinical Course as of 12/28/23 1726  Wed Dec 28, 2023  1641 Reevaluation the patient showed significant improvement after nebulized therapy.  Pending lipase/D-dimer [CR]    Clinical Course User Index [CR] [place] Butter, PA                                 Medical Decision Making Amount and/or Complexity of Data Reviewed Labs: ordered. Radiology: ordered.  Risk OTC drugs. Prescription drug management.   This patient presents to the ED for concern of abdominal pain, shortness of breath, this involves an extensive number of treatment options, and is a complaint that carries with it a high risk of complications and morbidity.  The differential  diagnosis includes COPD, asthma, pneumonia, ACS, PE, pneumothorax, gastritis, PUD, cholecystitis, CBD pathology,  SBO/LBO,    Co morbidities that complicate the patient evaluation  See HPI   Additional history obtained:  Additional history obtained from EMR External records from outside source obtained and reviewed including hospital records   Lab Tests:  I Ordered, and personally interpreted labs.  The pertinent results include: No leukocytosis.  No evidence of anemia.  Placed within range.  Mild decreased bicarb of 21 with and 16.  No transaminitis.  No renal dysfunction.  Troponin of 16.  Lipase within normal levels.  Viral testing negative.  D-dimer negative   Imaging Studies ordered:  I ordered imaging studies including chest x-ray I independently visualized and interpreted imaging which showed no  acute cardiopulmonary abnormality.  Hyperinflation.  Flattening of the diaphragm bilaterally. I agree with the radiologist interpretation   Cardiac Monitoring: / EKG:  The patient was maintained on a cardiac monitor.  I personally viewed and interpreted the cardiac monitored which showed an underlying rhythm of: Yeah normal sinus rhythm.  LVH.  Nonspecific ST/T wave changes.   Consultations Obtained:  I requested consultation with attending Dr. Moses Arenas visiting where she would like going forward.   Problem List / ED Course / Critical interventions / Medication management  Shortness of breath I ordered medication including DuoNeb, albuterol    Reevaluation of the patient after these medicines showed that the patient improved I have reviewed the patients home medicines and have made adjustments as needed   Social Determinants of Health:  Polysubstance use.   Test / Admission - Considered:  Shortness of breath Vitals signs significant for hypertension blood pressure 145/113. Otherwise within normal range and stable throughout visit. Laboratory/imaging studies significant  for: see above 72-year-old male presents emergency department with complaints of nasal congestion, shortness of breath, body aches.  States that he has been having symptoms progressively worsening over the past several days.  Denies any known sick exposure.  States that he was having some cramping abdominal pain earlier today with some nausea but states that his symptoms have since improved after having a bowel movement states that he feels like he has been constipated.  Denies any known fevers, chest pain, vomiting, urinary symptoms, change in bowel habits. On exam, no appreciable abdominal tenderness.  Patient with decreased lung sounds bilateral lower lung fields but without obvious rales, rhonchi or significant wheeze.  Workup today reassuring.  Patient with negative troponin, nonspecific changes on EKG; low suspicion for ACS.  Negative D-dimer; low suspicion for PE.  Patient without chest pain rating to back compressed deficits, neurodeficits, significant hypertension; low suspicion for dissection.  Treated with DuoNeb as well as albuterol  breathing treatments and noted significant preoperative symptoms.  Subsequent lung exam did show evidence of wheezing although the faint.  Suspect the patient's symptoms likely secondary to COPD exacerbation, close patient on prednisone  recommended continued to deteriorate symptoms and home breathing therapies.  Recommend close follow-up with PCP/pulmonology in the outpatient setting.  Treatment plan discussed with patient.  Combination was given to cefepime.  Patient will well-appearing, afebrile in no acute distress. Worrisome signs and symptoms were discussed with the patient, and the patient acknowledged understanding to return to the ED if noticed. Patient was stable upon discharge.          Final Clinical Impression(s) / ED Diagnoses Final diagnoses:  None    Rx / DC Orders ED Discharge Orders     None         [place] Butter, [place] 12/28/23  Viviane Gros    Guadalupe Lee, MD 01/03/24 1530

## 2023-12-28 NOTE — ED Notes (Signed)
 Rt Note: Patient has clear to diminished breath sounds and stated that he jut can't go to the bathroom. He feels like that I contributing to the SOB. His stomach hurts

## 2023-12-28 NOTE — Discharge Instructions (Addendum)
 As discussed, your workup today was overall reassuring.  Suspect your symptoms are likely secondary to COPD flareup.  Will send you home with refill of the albuterol  inhaler as well as prednisone to take for the next few days.  Also sent with MiraLAX  to take for feelings of constipation.  Begin with 2 to 4 packets and increase if you still feel constipated or decrease if you develop diarrhea.  Recommend daily fiber supplement as well as continue oral hydration.  Recommend close follow-up with your primary care for reevaluation.  Please do not hesitate to return if the worrisome signs and symptoms we discussed become apparent.

## 2023-12-28 NOTE — ED Triage Notes (Addendum)
 Feeling congestion, cough. SOB. Fatigue. 3 days. Afebrile. Feels nauseated. Pain in chest and abd.

## 2024-07-05 ENCOUNTER — Other Ambulatory Visit: Payer: Self-pay | Admitting: Family Medicine

## 2024-07-05 DIAGNOSIS — K7469 Other cirrhosis of liver: Secondary | ICD-10-CM
